# Patient Record
Sex: Male | Born: 1960 | Race: Black or African American | Hispanic: No | Marital: Single
Health system: Southern US, Community
[De-identification: ages and names within clinical notes are randomized; demographics above are authoritative.]

## PROBLEM LIST (undated history)

## (undated) DIAGNOSIS — F4310 Post-traumatic stress disorder, unspecified: Secondary | ICD-10-CM

## (undated) DIAGNOSIS — F1010 Alcohol abuse, uncomplicated: Secondary | ICD-10-CM

## (undated) DIAGNOSIS — F259 Schizoaffective disorder, unspecified: Secondary | ICD-10-CM

## (undated) DIAGNOSIS — F250 Schizoaffective disorder, bipolar type: Secondary | ICD-10-CM

## (undated) DIAGNOSIS — F319 Bipolar disorder, unspecified: Secondary | ICD-10-CM

## (undated) DIAGNOSIS — B009 Herpesviral infection, unspecified: Secondary | ICD-10-CM

## (undated) DIAGNOSIS — I10 Essential (primary) hypertension: Secondary | ICD-10-CM

## (undated) HISTORY — PX: CARDIAC SURGERY: SHX584

---

## 2017-04-29 ENCOUNTER — Ambulatory Visit (HOSPITAL_COMMUNITY): Admission: EM | Admit: 2017-04-29 | Discharge: 2017-04-29 | Discharge status: Against Medical Advice | Payer: Self-pay

## 2017-07-24 ENCOUNTER — Encounter (HOSPITAL_COMMUNITY): Payer: Self-pay

## 2017-07-24 ENCOUNTER — Emergency Department (HOSPITAL_COMMUNITY)
Admission: EM | Admit: 2017-07-24 | Discharge: 2017-07-24 | Disposition: A | Discharge status: Home / Self Care | Payer: Self-pay | Attending: Emergency Medicine | Admitting: Emergency Medicine

## 2017-07-24 ENCOUNTER — Other Ambulatory Visit: Payer: Self-pay

## 2017-07-24 DIAGNOSIS — Z7722 Contact with and (suspected) exposure to environmental tobacco smoke (acute) (chronic): Secondary | ICD-10-CM | POA: Insufficient documentation

## 2017-07-24 DIAGNOSIS — Z008 Encounter for other general examination: Secondary | ICD-10-CM

## 2017-07-24 DIAGNOSIS — Z0489 Encounter for examination and observation for other specified reasons: Secondary | ICD-10-CM | POA: Insufficient documentation

## 2017-07-24 DIAGNOSIS — I10 Essential (primary) hypertension: Secondary | ICD-10-CM | POA: Insufficient documentation

## 2017-07-24 HISTORY — DX: Post-traumatic stress disorder, unspecified: F43.10

## 2017-07-24 HISTORY — DX: Essential (primary) hypertension: I10

## 2017-07-24 HISTORY — DX: Schizoaffective disorder, unspecified: F25.9

## 2017-07-24 HISTORY — DX: Alcohol abuse, uncomplicated: F10.10

## 2017-07-24 HISTORY — DX: Schizoaffective disorder, bipolar type: F25.0

## 2017-07-24 HISTORY — DX: Bipolar disorder, unspecified: F31.9

## 2017-07-24 HISTORY — DX: Herpesviral infection, unspecified: B00.9

## 2017-07-24 LAB — CBC WITH DIFFERENTIAL/PLATELET
BASOS PCT: 0 %
Basophils Absolute: 0 10*3/uL (ref 0.0–0.1)
EOS ABS: 0.1 10*3/uL (ref 0.0–0.7)
Eosinophils Relative: 1 %
HCT: 46.1 % (ref 39.0–52.0)
HEMOGLOBIN: 15.7 g/dL (ref 13.0–17.0)
LYMPHS ABS: 1.5 10*3/uL (ref 0.7–4.0)
Lymphocytes Relative: 18 %
MCH: 27.5 pg (ref 26.0–34.0)
MCHC: 34.1 g/dL (ref 30.0–36.0)
MCV: 80.9 fL (ref 78.0–100.0)
Monocytes Absolute: 0.9 10*3/uL (ref 0.1–1.0)
Monocytes Relative: 11 %
NEUTROS PCT: 70 %
Neutro Abs: 5.6 10*3/uL (ref 1.7–7.7)
Platelets: 388 10*3/uL (ref 150–400)
RBC: 5.7 MIL/uL (ref 4.22–5.81)
RDW: 14.2 % (ref 11.5–15.5)
WBC: 8.1 10*3/uL (ref 4.0–10.5)

## 2017-07-24 LAB — COMPREHENSIVE METABOLIC PANEL
ALBUMIN: 4 g/dL (ref 3.5–5.0)
ALK PHOS: 58 U/L (ref 38–126)
ALT: 12 U/L — AB (ref 17–63)
AST: 17 U/L (ref 15–41)
Anion gap: 10 (ref 5–15)
BUN: 10 mg/dL (ref 6–20)
CALCIUM: 9.5 mg/dL (ref 8.9–10.3)
CO2: 26 mmol/L (ref 22–32)
CREATININE: 0.87 mg/dL (ref 0.61–1.24)
Chloride: 102 mmol/L (ref 101–111)
GFR calc Af Amer: >60 mL/min (ref >60)
GFR calc non Af Amer: >60 mL/min (ref >60)
GLUCOSE: 99 mg/dL (ref 65–99)
Potassium: 4.3 mmol/L (ref 3.5–5.1)
SODIUM: 138 mmol/L (ref 135–145)
Total Bilirubin: 1.8 mg/dL — ABNORMAL HIGH (ref 0.3–1.2)
Total Protein: 7.4 g/dL (ref 6.5–8.1)

## 2017-07-24 LAB — TSH: TSH: 0.905 u[IU]/mL (ref 0.350–4.500)
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
## 2017-07-24 NOTE — Discharge Instructions (Signed)
Please read attached information. If you experience any new or worsening signs or symptoms please return to the emergency room for evaluation. Please follow-up with your primary care provider or specialist as discussed. Please use medication prescribed only as directed and discontinue taking if you have any concerning signs or symptoms.   °

## 2017-07-24 NOTE — ED Notes (Signed)
VSS. Pt ambulatory to lobby with steady gait. Pt verbalized understanding of all d/c instructions and f/u with [facility].

## 2017-07-24 NOTE — ED Provider Notes (Addendum)
[facility] EMERGENCY DEPARTMENT Provider Note   CSN: [id number] Arrival date & time: 07/24/17  1539     History   Chief Complaint Chief Complaint  Patient presents with  . Medical Clearance    HPI Naeem Quillin III is a 57 y.o. male.  HPI   57 year old male presents today for medical clearance.  Patient notes he has had suicidal ideations with hallucinations.  He notes that he has placement at [facility] but needs CMP, CBC and TSH before they will accept him.  Patient is accompanied by his family member who will be taking him over to [facility] after medical clearance.  Patient denies any physical complaints today.  Past Medical History:  Diagnosis Date  . Alcohol abuse   . Bipolar 1 disorder (HCC)   . Herpes   . Hypertension   . PTSD (post-traumatic stress disorder)   . Schizo affective schizophrenia (HCC)     There are no active problems to display for this patient.   he histories are not reviewed yet. Please review them in the "History" navigator section and refresh this SmartLink.     Home Medications    Prior to Admission medications   Not on File    Family History No family history on file.  Social History Social History   Tobacco Use  . Smoking status: Passive Smoke Exposure - Never Smoker  . Smokeless tobacco: Never Used  Substance Use Topics  . Alcohol use: No    Frequency: Never  . Drug use: No     Allergies   Patient has no known allergies.   Review of Systems Review of Systems  All other systems reviewed and are negative.    Physical Exam Updated Vital Signs BP 125/86 (BP Location: Right Arm)   Pulse 80   Temp 98.2 F (36.8 C) (Oral)   Resp 16   Ht 5\' 5"  (1.651 m)   Wt 69.4 kg (153 lb)   SpO2 99%   BMI 25.46 kg/m   Physical Exam  Constitutional: He is oriented to person, place, and time. He appears well-developed and well-nourished.  HENT:  Head: Normocephalic and atraumatic.  Eyes: Conjunctivae are  normal. Pupils are equal, round, and reactive to light. Right eye exhibits no discharge. Left eye exhibits no discharge. No scleral icterus.  Neck: Normal range of motion. No JVD present. No tracheal deviation present.  Cardiovascular: Normal rate, regular rhythm, normal heart sounds and intact distal pulses. Exam reveals no gallop.  No murmur heard. Pulmonary/Chest: Effort normal and breath sounds normal. No stridor. No respiratory distress.  Neurological: He is alert and oriented to person, place, and time. Coordination normal.  Psychiatric: He has a normal mood and affect. His behavior is normal. Judgment and thought content normal.  Nursing note and vitals reviewed.    ED Treatments / Results  Labs (all labs ordered are listed, but only abnormal results are displayed) Labs Reviewed  COMPREHENSIVE METABOLIC PANEL - Abnormal; Notable for the following components:      Result Value   ALT 12 (*)    Total Bilirubin 1.8 (*)    All other components within normal limits  CBC WITH DIFFERENTIAL/PLATELET  TSH    EKG  EKG Interpretation None       Radiology No results found.  Procedures Procedures (including critical care time)  Medications Ordered in ED Medications - No data to display   Initial Impression / Assessment and Plan / ED Course  I have reviewed the  triage vital signs and the nursing notes.  Pertinent labs & imaging results that were available during my care of the patient were reviewed by me and considered in my medical decision making (see chart for details).      Final Clinical Impressions(s) / ED Diagnoses   Final diagnoses:  Medical clearance for psychiatric admission    Labs: CBC, CMP, TSH  Imaging:  Consults:  Therapeutics:  Discharge Meds:   Assessment/Plan: 57 year old male presents today for medical clearance from [facility].  Patient is well-appearing no acute distress.  He has no physical complaints.  He is medically cleared and will be  discharged with his brother for placement at [facility].  Patient is given return precautions, he verbalized understanding and agreement to today's plan.    ED Discharge Orders    None       Rosalio LoudHedges, Cipriano Millikan, PA-C 07/24/17 1823    Eyvonne MechanicHedges, Zyrus Hetland, PA-C 07/24/17 Leslye [id number]    Zackowski, Scott, MD 07/25/17 [phone]

## 2017-07-24 NOTE — ED Triage Notes (Signed)
Pt arrives to ED from [facility] for medical clearance. Pt has been accepted to facility and is to return to [facility] once medically cleared at this time. PT bother at bedside in triage.

## 2017-11-06 ENCOUNTER — Encounter (HOSPITAL_COMMUNITY): Payer: Self-pay

## 2017-11-06 ENCOUNTER — Emergency Department (HOSPITAL_COMMUNITY): Payer: Self-pay

## 2017-11-06 ENCOUNTER — Other Ambulatory Visit: Payer: Self-pay

## 2017-11-06 ENCOUNTER — Emergency Department (HOSPITAL_COMMUNITY)
Admission: EM | Admit: 2017-11-06 | Discharge: 2017-11-06 | Disposition: A | Discharge status: Home / Self Care | Payer: Self-pay | Attending: Emergency Medicine | Admitting: Emergency Medicine

## 2017-11-06 DIAGNOSIS — I10 Essential (primary) hypertension: Secondary | ICD-10-CM | POA: Insufficient documentation

## 2017-11-06 DIAGNOSIS — R319 Hematuria, unspecified: Secondary | ICD-10-CM | POA: Insufficient documentation

## 2017-11-06 DIAGNOSIS — Z7722 Contact with and (suspected) exposure to environmental tobacco smoke (acute) (chronic): Secondary | ICD-10-CM | POA: Insufficient documentation

## 2017-11-06 DIAGNOSIS — S161XXA Strain of muscle, fascia and tendon at neck level, initial encounter: Secondary | ICD-10-CM | POA: Insufficient documentation

## 2017-11-06 DIAGNOSIS — S39012A Strain of muscle, fascia and tendon of lower back, initial encounter: Secondary | ICD-10-CM | POA: Insufficient documentation

## 2017-11-06 DIAGNOSIS — R079 Chest pain, unspecified: Secondary | ICD-10-CM | POA: Insufficient documentation

## 2017-11-06 DIAGNOSIS — Y9389 Activity, other specified: Secondary | ICD-10-CM | POA: Insufficient documentation

## 2017-11-06 DIAGNOSIS — R51 Headache: Secondary | ICD-10-CM | POA: Insufficient documentation

## 2017-11-06 DIAGNOSIS — Y92410 Unspecified street and highway as the place of occurrence of the external cause: Secondary | ICD-10-CM | POA: Insufficient documentation

## 2017-11-06 DIAGNOSIS — Y999 Unspecified external cause status: Secondary | ICD-10-CM | POA: Insufficient documentation

## 2017-11-06 DIAGNOSIS — S5001XA Contusion of right elbow, initial encounter: Secondary | ICD-10-CM | POA: Insufficient documentation

## 2017-11-06 LAB — TYPE AND SCREEN
ABO/RH(D): A POS
Antibody Screen: NEGATIVE

## 2017-11-06 LAB — COMPREHENSIVE METABOLIC PANEL
ALBUMIN: 4.3 g/dL (ref 3.5–5.0)
ALT: 14 U/L — AB (ref 17–63)
ANION GAP: 9 (ref 5–15)
AST: 20 U/L (ref 15–41)
Alkaline Phosphatase: 49 U/L (ref 38–126)
BUN: 10 mg/dL (ref 6–20)
CHLORIDE: 103 mmol/L (ref 101–111)
CO2: 25 mmol/L (ref 22–32)
CREATININE: 0.99 mg/dL (ref 0.61–1.24)
Calcium: 9.5 mg/dL (ref 8.9–10.3)
GFR calc non Af Amer: >60 mL/min (ref >60)
Glucose, Bld: 98 mg/dL (ref 65–99)
Potassium: 3.9 mmol/L (ref 3.5–5.1)
SODIUM: 137 mmol/L (ref 135–145)
Total Bilirubin: 1.2 mg/dL (ref 0.3–1.2)
Total Protein: 7.2 g/dL (ref 6.5–8.1)

## 2017-11-06 LAB — CBC WITH DIFFERENTIAL/PLATELET
Abs Immature Granulocytes: 0.1 10*3/uL (ref 0.0–0.1)
Basophils Absolute: 0.1 10*3/uL (ref 0.0–0.1)
Basophils Relative: 0 %
Eosinophils Absolute: 0 10*3/uL (ref 0.0–0.7)
Eosinophils Relative: 0 %
HEMATOCRIT: 42.4 % (ref 39.0–52.0)
Hemoglobin: 14.3 g/dL (ref 13.0–17.0)
IMMATURE GRANULOCYTES: 1 %
LYMPHS ABS: 1.1 10*3/uL (ref 0.7–4.0)
Lymphocytes Relative: 6 %
MCH: 27 pg (ref 26.0–34.0)
MCHC: 33.7 g/dL (ref 30.0–36.0)
MCV: 80 fL (ref 78.0–100.0)
MONOS PCT: 8 %
Monocytes Absolute: 1.5 10*3/uL — ABNORMAL HIGH (ref 0.1–1.0)
NEUTROS ABS: 16.6 10*3/uL — AB (ref 1.7–7.7)
NEUTROS PCT: 85 %
Platelets: 417 10*3/uL — ABNORMAL HIGH (ref 150–400)
RBC: 5.3 MIL/uL (ref 4.22–5.81)
RDW: 13.6 % (ref 11.5–15.5)
WBC: 19.4 10*3/uL — ABNORMAL HIGH (ref 4.0–10.5)

## 2017-11-06 LAB — URINALYSIS, MICROSCOPIC (REFLEX)

## 2017-11-06 LAB — URINALYSIS, ROUTINE W REFLEX MICROSCOPIC

## 2017-11-06 LAB — ABO/RH: ABO/RH(D): A POS

## 2017-11-06 MED ORDER — IOHEXOL 300 MG/ML  SOLN
100.0000 mL | Freq: Once | INTRAMUSCULAR | Status: AC | PRN
  Administered 2017-11-06: 100 mL via INTRAVENOUS

## 2017-11-06 MED ORDER — MORPHINE SULFATE (PF) 4 MG/ML IV SOLN
4.0000 mg | INTRAVENOUS | Status: DC | PRN
Start: 2017-11-06 — End: 2017-11-07

## 2017-11-06 MED ORDER — OXYCODONE-ACETAMINOPHEN 5-325 MG PO TABS: 1.0000 | ORAL_TABLET | Freq: Three times a day (TID) | ORAL | 0 refills | Status: DC | PRN

## 2017-11-06 MED ORDER — METHOCARBAMOL 500 MG PO TABS: 500.0000 mg | ORAL_TABLET | Freq: Two times a day (BID) | ORAL | 0 refills | Status: DC

## 2017-11-06 MED ORDER — CEPHALEXIN 500 MG PO CAPS: 500.0000 mg | ORAL_CAPSULE | Freq: Two times a day (BID) | ORAL | 0 refills | Status: AC

## 2017-11-06 MED ORDER — MORPHINE SULFATE (PF) 4 MG/ML IV SOLN
4.0000 mg | Freq: Once | INTRAVENOUS | Status: AC
  Administered 2017-11-06: 4 mg via INTRAVENOUS
  Filled 2017-11-06: qty 1

## 2017-11-06 MED ORDER — IOPAMIDOL (ISOVUE-300) INJECTION 61%
INTRAVENOUS | Status: AC
  Filled 2017-11-06: qty 50

## 2017-11-06 MED ORDER — SODIUM CHLORIDE 0.9 % IV BOLUS
1000.0000 mL | Freq: Once | INTRAVENOUS | Status: AC
  Administered 2017-11-06: 1000 mL via INTRAVENOUS
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
## 2017-11-06 NOTE — ED Notes (Signed)
Pt ambulated in hallway without assistance by staff. Leg bag applied, foley care education performed; food provided to pt.

## 2017-11-06 NOTE — ED Notes (Signed)
ED Provider at bedside. 
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
## 2017-11-06 NOTE — ED Notes (Signed)
Patient transported to CT 
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
## 2017-11-06 NOTE — ED Notes (Signed)
Patient verbalizes understanding of discharge instructions. Opportunity for questioning and answers were provided. Armband removed by staff, pt discharged from ED via wheelchair with male companion.

## 2018-05-19 ENCOUNTER — Emergency Department (HOSPITAL_COMMUNITY)
Admission: EM | Admit: 2018-05-19 | Discharge: 2018-05-19 | Disposition: A | Discharge status: Home / Self Care | Payer: Self-pay | Attending: Emergency Medicine | Admitting: Emergency Medicine

## 2018-05-19 DIAGNOSIS — B001 Herpesviral vesicular dermatitis: Secondary | ICD-10-CM | POA: Insufficient documentation

## 2018-05-19 DIAGNOSIS — Z79899 Other long term (current) drug therapy: Secondary | ICD-10-CM | POA: Insufficient documentation

## 2018-05-19 DIAGNOSIS — Z7722 Contact with and (suspected) exposure to environmental tobacco smoke (acute) (chronic): Secondary | ICD-10-CM | POA: Insufficient documentation

## 2018-05-19 DIAGNOSIS — I10 Essential (primary) hypertension: Secondary | ICD-10-CM | POA: Insufficient documentation

## 2018-05-19 MED ORDER — ACYCLOVIR 400 MG PO TABS: 400.0000 mg | ORAL_TABLET | Freq: Three times a day (TID) | ORAL | 0 refills | Status: DC

## 2018-05-19 NOTE — ED Provider Notes (Signed)
[facility] Provider Note   CSN: [id number] Arrival date & time: 05/19/18  1719     History   Chief Complaint Chief Complaint  Patient presents with  . Mouth Lesions    HPI Seth Bright is a 57 y.o. male.  HPI Complains of mildly painful sore on his upper lip which started yesterday.  Feels like herpes sore but he has had in the past.  No other associated symptoms.  No treatment prior to coming here.  Nothing makes symptoms better or worse. Past Medical History:  Diagnosis Date  . Alcohol abuse   . Bipolar 1 disorder (HCC)   . Herpes   . Hypertension   . PTSD (post-traumatic stress disorder)   . Schizo affective schizophrenia (HCC)     There are no active problems to display for this patient.   Past Surgical History:  Procedure Laterality Date  . CARDIAC SURGERY     stent placement 2016        Home Medications    Prior to Admission medications   Medication Sig Start Date End Date Taking? Authorizing Provider  methocarbamol (ROBAXIN) 500 MG tablet Take 1 tablet (500 mg total) by mouth 2 (two) times daily. 11/06/17   McDonald, Mia A, PA-C  oxyCODONE-acetaminophen (PERCOCET/ROXICET) 5-325 MG tablet Take 1 tablet by mouth every 8 (eight) hours as needed for severe pain. 11/06/17   McDonald, Mia A, PA-C    Family History No family history on file.  Social History Social History   Tobacco Use  . Smoking status: Passive Smoke Exposure - Never Smoker  . Smokeless tobacco: Never Used  Substance Use Topics  . Alcohol use: No    Frequency: Never  . Drug use: No     Allergies   Patient has no known allergies.   Review of Systems Review of Systems  Constitutional: Negative.   Skin: Positive for wound.       Lip lesion     Physical Exam Updated Vital Signs BP 122/81 (BP Location: Right Arm)   Pulse 83   Temp 98.7 F (37.1 C) (Oral)   Resp 17   Ht 5\' 5"  (1.651 m)   Wt 72.6 kg   SpO2 96%   BMI 26.63  kg/m   Physical Exam Vitals signs and nursing note reviewed.  Constitutional:      Appearance: Normal appearance.  HENT:     Head: Normocephalic and atraumatic.     Right Ear: External ear normal.     Left Ear: External ear normal.     Mouth/Throat:     Mouth: Mucous membranes are moist.     Comments: 2 to 3 mm white shallow ulcer on upper lip no other mucosal lesion Eyes:     Pupils: Pupils are equal, round, and reactive to light.  Neck:     Musculoskeletal: Normal range of motion.  Cardiovascular:     Rate and Rhythm: Normal rate.  Pulmonary:     Effort: Pulmonary effort is normal.  Abdominal:     General: There is no distension.  Skin:    General: Skin is warm and dry.     Findings: No rash.  Neurological:     Mental Status: He is alert and oriented to person, place, and time.  Psychiatric:        Behavior: Behavior normal.      ED Treatments / Results  Labs (all labs ordered are listed, but only abnormal results are displayed)  Labs Reviewed - No data to display  EKG None  Radiology No results found.  Procedures Procedures (including critical care time)  Medications Ordered in ED Medications - No data to display   Initial Impression / Assessment and Plan / ED Course  I have reviewed the triage vital signs and the nursing notes.  Pertinent labs & imaging results that were available during my care of the patient were reviewed by me and considered in my medical decision making (see chart for details).     Explained to patient that herpes labialis is self-limiting.  Antivirals may reduce length of illness slightly.  He is requesting prescription.  Plan prescription acyclovir  Final Clinical Impressions(s) / ED Diagnoses  Diagnosis herpes labialis (cold Sore) Final diagnoses:  None    ED Discharge Orders    None       Doug SouJacubowitz, Emelio Schneller, MD 05/19/18 1743

## 2018-05-19 NOTE — ED Triage Notes (Signed)
Pt has sore to upper lip since yesterday

## 2018-05-28 ENCOUNTER — Emergency Department (HOSPITAL_COMMUNITY)
Admission: EM | Admit: 2018-05-28 | Discharge: 2018-05-31 | Disposition: A | Discharge status: Other Acute Inpatient Hospital | Payer: Medicaid Other | Attending: Emergency Medicine | Admitting: Emergency Medicine

## 2018-05-28 DIAGNOSIS — F209 Schizophrenia, unspecified: Secondary | ICD-10-CM | POA: Diagnosis not present

## 2018-05-28 DIAGNOSIS — R45851 Suicidal ideations: Secondary | ICD-10-CM | POA: Insufficient documentation

## 2018-05-28 DIAGNOSIS — Z79899 Other long term (current) drug therapy: Secondary | ICD-10-CM | POA: Insufficient documentation

## 2018-05-28 DIAGNOSIS — I10 Essential (primary) hypertension: Secondary | ICD-10-CM | POA: Insufficient documentation

## 2018-05-28 DIAGNOSIS — Z7722 Contact with and (suspected) exposure to environmental tobacco smoke (acute) (chronic): Secondary | ICD-10-CM | POA: Diagnosis not present

## 2018-05-28 LAB — RAPID URINE DRUG SCREEN, HOSP PERFORMED
Amphetamines: NOT DETECTED
BENZODIAZEPINES: NOT DETECTED
Barbiturates: NOT DETECTED
Cocaine: NOT DETECTED
OPIATES: NOT DETECTED
Tetrahydrocannabinol: NOT DETECTED

## 2018-05-28 LAB — COMPREHENSIVE METABOLIC PANEL
ALK PHOS: 49 U/L (ref 38–126)
ALT: 15 U/L (ref 0–44)
ANION GAP: 10 (ref 5–15)
AST: 16 U/L (ref 15–41)
Albumin: 3.8 g/dL (ref 3.5–5.0)
BILIRUBIN TOTAL: 0.9 mg/dL (ref 0.3–1.2)
BUN: 5 mg/dL — ABNORMAL LOW (ref 6–20)
CALCIUM: 8.9 mg/dL (ref 8.9–10.3)
CO2: 23 mmol/L (ref 22–32)
Chloride: 106 mmol/L (ref 98–111)
Creatinine, Ser: 0.83 mg/dL (ref 0.61–1.24)
GLUCOSE: 118 mg/dL — AB (ref 70–99)
POTASSIUM: 3.7 mmol/L (ref 3.5–5.1)
Sodium: 139 mmol/L (ref 135–145)
TOTAL PROTEIN: 6.8 g/dL (ref 6.5–8.1)

## 2018-05-28 LAB — CBC
HCT: 44.5 % (ref 39.0–52.0)
Hemoglobin: 14.8 g/dL (ref 13.0–17.0)
MCH: 27.3 pg (ref 26.0–34.0)
MCHC: 33.3 g/dL (ref 30.0–36.0)
MCV: 82 fL (ref 80.0–100.0)
NRBC: 0 % (ref 0.0–0.2)
PLATELETS: 407 10*3/uL — AB (ref 150–400)
RBC: 5.43 MIL/uL (ref 4.22–5.81)
RDW: 14 % (ref 11.5–15.5)
WBC: 6.7 10*3/uL (ref 4.0–10.5)

## 2018-05-28 LAB — ETHANOL

## 2018-05-28 NOTE — ED Triage Notes (Signed)
Pt states that he has been having thoughts of harming himself as well as other people for the past couple of days. Family suggested that he come to the ED and be evaluated. Pt states that he planned to shoot other people and then shoot himself.

## 2018-05-29 LAB — LITHIUM LEVEL: Lithium Lvl: 0.08 mmol/L — ABNORMAL LOW (ref 0.60–1.20)

## 2018-05-29 MED ORDER — LITHIUM CARBONATE 300 MG PO CAPS
300.0000 mg | ORAL_CAPSULE | Freq: Three times a day (TID) | ORAL | Status: DC
  Administered 2018-05-29 – 2018-05-31 (×6): 300 mg via ORAL
  Filled 2018-05-29 (×7): qty 1

## 2018-05-29 MED ORDER — QUETIAPINE FUMARATE 200 MG PO TABS
200.0000 mg | ORAL_TABLET | Freq: Every day | ORAL | Status: DC
  Administered 2018-05-29 – 2018-05-30 (×2): 200 mg via ORAL
  Filled 2018-05-29 (×2): qty 1

## 2018-05-29 MED ORDER — GABAPENTIN 300 MG PO CAPS
300.0000 mg | ORAL_CAPSULE | Freq: Three times a day (TID) | ORAL | Status: DC
  Administered 2018-05-29 – 2018-05-31 (×7): 300 mg via ORAL
  Filled 2018-05-29 (×7): qty 1
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
## 2018-05-29 NOTE — BH Assessment (Signed)
Contacted pt's room via [employer] to complete [facility] assessment but there was no answer; will attempt again at a later time.
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
## 2018-05-29 NOTE — ED Notes (Signed)
Patient making his 1st phone call at this time. 

## 2018-05-29 NOTE — ED Notes (Signed)
Regular Diet was ordered for Lunch. 

## 2018-05-29 NOTE — ED Notes (Signed)
Ordered breakfast tray, Diet Regular -no sharps

## 2018-05-29 NOTE — ED Notes (Signed)
Pt seen to be ambulatory to the bathroom. Steady gait, no difficulty noted.

## 2018-05-29 NOTE — ED Notes (Signed)
Patient is resting comfortably. 

## 2018-05-29 NOTE — ED Provider Notes (Signed)
[facility] Provider Note   CSN: [id number] Arrival date & time: 05/28/18  [id number]     History   Chief Complaint Chief Complaint  Patient presents with  . Suicidal    HPI Seth Bright is a 58 y.o. male.  The history is provided by the patient and medical records.    58 year old male with history of alcohol abuse, bipolar disorder, hypertension, PTSD, schizoaffective disorder, presenting to the ED with suicidal and homicidal ideation.  Patient reports he got out of prison about 1 year ago after a 29-year sentence.  States once he left, he did not really understand how to adjust back into society.  States he has been staying with his sister and taking jobs from the temp agency but states "I can't get my mind right".  He describes his mind as a room that is cluttered with people in numerous different voices and scenarios that are going on.  States he cannot seem to organize his thoughts or make sense of what is going on.  States some days are better than others but lately has been getting substantially worse.  States whenever he tries to go to his job or is out in public he constantly feels paranoid like people are out to get him are talking about him behind his back.  States he thinks this was from being institutionalized for so long and having to "watch his back".  States sometimes he does not really know what to do, not sure if he wants to kill himself and just be rid of everything or her other people.  He has not made any attempts at self-harm.  He does admit he has thought about overdosing on pills or shooting himself.  He does not currently have access to weapons.  He was previously staying with his sister but states he decided to leave there to see if he would do better on his own.  He has been staying in his car for the past week does not really been resting or eating regularly.  States he has not been taking his prescribed medications and has not  had any recent follow-up with his psychiatrist.  He states he truly wants to get his mind right so that he can function normally again. He denies any EtOH or substance abuse. No hallucinations reported.    Past Medical History:  Diagnosis Date  . Alcohol abuse   . Bipolar 1 disorder (HCC)   . Herpes   . Hypertension   . PTSD (post-traumatic stress disorder)   . Schizo affective schizophrenia (HCC)     There are no active problems to display for this patient.   Past Surgical History:  Procedure Laterality Date  . CARDIAC SURGERY     stent placement 2016        Home Medications    Prior to Admission medications   Medication Sig Start Date End Date Taking? Authorizing Provider  gabapentin (NEURONTIN) 300 MG capsule Take 300 mg by mouth 3 (three) times daily.   Yes [provider]  lithium carbonate 300 MG capsule Take 300 mg by mouth 3 (three) times daily with meals.   Yes [provider]  QUEtiapine (SEROQUEL) 200 MG tablet Take 200 mg by mouth at bedtime.   Yes [provider]  acyclovir (ZOVIRAX) 400 MG tablet Take 1 tablet (400 mg total) by mouth 3 (three) times daily. Patient not taking: Reported on 05/29/2018 05/19/18   Doug SouJacubowitz, Sam, MD  methocarbamol (  ROBAXIN) 500 MG tablet Take 1 tablet (500 mg total) by mouth 2 (two) times daily. Patient not taking: Reported on 05/29/2018 11/06/17   McDonald, Pedro EarlsMia A, PA-C  oxyCODONE-acetaminophen (PERCOCET/ROXICET) 5-325 MG tablet Take 1 tablet by mouth every 8 (eight) hours as needed for severe pain. Patient not taking: Reported on 05/29/2018 11/06/17   Barkley BoardsMcDonald, Mia A, PA-C    Family History No family history on file.  Social History Social History   Tobacco Use  . Smoking status: Passive Smoke Exposure - Never Smoker  . Smokeless tobacco: Never Used  Substance Use Topics  . Alcohol use: No    Frequency: Never  . Drug use: No     Allergies   Patient has no known allergies.   Review of  Systems Review of Systems  Psychiatric/Behavioral: Positive for suicidal ideas.  All other systems reviewed and are negative.    Physical Exam Updated Vital Signs BP 138/87 (BP Location: Right Arm)   Pulse 79   Temp 98.6 F (37 C)   Resp 16   Ht 5\' 5"  (1.651 m)   Wt 72.6 kg   SpO2 98%   BMI 26.63 kg/m   Physical Exam Vitals signs and nursing note reviewed.  Constitutional:      Appearance: He is well-developed.  HENT:     Head: Normocephalic and atraumatic.  Eyes:     Conjunctiva/sclera: Conjunctivae normal.     Pupils: Pupils are equal, round, and reactive to light.  Neck:     Musculoskeletal: Normal range of motion.  Cardiovascular:     Rate and Rhythm: Normal rate and regular rhythm.     Heart sounds: Normal heart sounds.  Pulmonary:     Effort: Pulmonary effort is normal.     Breath sounds: Normal breath sounds. No stridor. No rhonchi.  Abdominal:     General: Bowel sounds are normal.     Palpations: Abdomen is soft.  Musculoskeletal: Normal range of motion.  Skin:    General: Skin is warm and dry.  Neurological:     Mental Status: He is alert and oriented to person, place, and time.  Psychiatric:        Attention and Perception: He does not perceive auditory hallucinations.        Thought Content: Thought content includes homicidal and suicidal ideation. Thought content includes homicidal and suicidal plan.     Comments: Seems overwhelmed, has trouble expressing himself and seems worried, he is not paranoid during my exam SI with plan to OD or shoot himself; some passive homicidal thoughts with plan to shoot people around him      ED Treatments / Results  Labs (all labs ordered are listed, but only abnormal results are displayed) Labs Reviewed  COMPREHENSIVE METABOLIC PANEL - Abnormal; Notable for the following components:      Result Value   Glucose, Bld 118 (*)    BUN 5 (*)    All other components within normal limits  CBC - Abnormal; Notable for  the following components:   Platelets 407 (*)    All other components within normal limits  LITHIUM LEVEL - Abnormal; Notable for the following components:   Lithium Lvl 0.08 (*)    All other components within normal limits  ETHANOL  RAPID URINE DRUG SCREEN, HOSP PERFORMED    EKG None  Radiology No results found.  Procedures Procedures (including critical care time)  Medications Ordered in ED Medications - No data to display   Initial Impression /  Assessment and Plan / ED Course  I have reviewed the triage vital signs and the nursing notes.  Pertinent labs & imaging results that were available during my care of the patient were reviewed by me and considered in my medical decision making (see chart for details).  58 year old male here with suicidal and homicidal ideation.  Released from prison approximately 1 year ago after 29-year incarceration.  It sounds like since being released he has had trouble adjusting to normal life again.  He describes his mind is a "crowded room" and has trouble compartmentalizing and figuring out what all is going on.  States some days are better than others but overall lately he has been having a lot of trouble with this.  He does report sometimes he feels suicidal, has had thoughts of shooting himself or taking medications.  States other times he feels like shooting other people.  He does not currently have access to weapons.  He denies any physical complaints.  His labs are overall reassuring.  States he has been noncompliant with his home medications and has not followed up with his psychiatrist.  We will plan for TTS evaluation.  Final Clinical Impressions(s) / ED Diagnoses   Final diagnoses:  Suicidal ideation    ED Discharge Orders    None       Garlon Hatchet, PA-C 05/29/18 0626    Ward, Layla Maw, DO 05/29/18 [phone]

## 2018-05-29 NOTE — Progress Notes (Signed)
Pt. meets criteria for inpatient treatment per Assunta Found, NP.  Referred out to the following hospitals:  [facility]  [facility]  [facility]  [facility]  [facility]  [facility]  [facility]  [facility]  [facility]  [facility]     Disposition CSW will continue to follow for placement.  Timmothy Euler. Kaylyn Lim, MSW, LCSWA Disposition Clinical Social Work [phone] (cell) [phone] (office)

## 2018-05-29 NOTE — ED Notes (Signed)
Meal tray arrived

## 2018-05-30 NOTE — ED Notes (Signed)
Patient ambulatory to bathroom with steady gait at this time 

## 2018-05-30 NOTE — ED Notes (Signed)
Patient currently using his 2nd phone call of the day. Calm and cooperative.

## 2018-05-30 NOTE — ED Notes (Signed)
This RN asked patient to get off the phone since it had been longer than , patient complied and got off the phone, now back in room.

## 2018-05-30 NOTE — BH Assessment (Signed)
Pt reports he rested well due to Seroquel and has not eaten this AM yet but has an appetite. Pt reports he still has SI, "thoughts running through my head"the patient denies HI and AH/VH. Pt continues to meet inpatient criteria.

## 2018-05-30 NOTE — ED Notes (Addendum)
Patient asked to use the phone for , patient was told he was able to by this RN.

## 2018-05-30 NOTE — ED Notes (Signed)
Pt is at desk calling his wife back-Monique,RN

## 2018-05-30 NOTE — ED Notes (Signed)
Patient ambulatory to shower without difficulty.

## 2018-05-30 NOTE — ED Notes (Signed)
Dinner tray ordered for patient.

## 2018-05-31 ENCOUNTER — Encounter (HOSPITAL_COMMUNITY): Payer: Self-pay | Admitting: *Deleted

## 2018-05-31 ENCOUNTER — Inpatient Hospital Stay (HOSPITAL_COMMUNITY)
Admission: AD | Admit: 2018-05-31 | Discharge: 2018-06-07 | DRG: 885 | Disposition: A | Discharge status: Home / Self Care | Payer: Medicaid Other | Source: Intra-hospital | Attending: Psychiatry | Admitting: Psychiatry

## 2018-05-31 ENCOUNTER — Other Ambulatory Visit: Payer: Self-pay

## 2018-05-31 DIAGNOSIS — G4700 Insomnia, unspecified: Secondary | ICD-10-CM | POA: Diagnosis present

## 2018-05-31 DIAGNOSIS — Z9114 Patient's other noncompliance with medication regimen: Secondary | ICD-10-CM | POA: Diagnosis not present

## 2018-05-31 DIAGNOSIS — Z79899 Other long term (current) drug therapy: Secondary | ICD-10-CM | POA: Diagnosis not present

## 2018-05-31 DIAGNOSIS — F4310 Post-traumatic stress disorder, unspecified: Secondary | ICD-10-CM | POA: Diagnosis not present

## 2018-05-31 DIAGNOSIS — Z915 Personal history of self-harm: Secondary | ICD-10-CM | POA: Diagnosis not present

## 2018-05-31 DIAGNOSIS — F312 Bipolar disorder, current episode manic severe with psychotic features: Secondary | ICD-10-CM | POA: Diagnosis not present

## 2018-05-31 DIAGNOSIS — R41843 Psychomotor deficit: Secondary | ICD-10-CM | POA: Diagnosis present

## 2018-05-31 DIAGNOSIS — Z818 Family history of other mental and behavioral disorders: Secondary | ICD-10-CM | POA: Diagnosis not present

## 2018-05-31 DIAGNOSIS — Z23 Encounter for immunization: Secondary | ICD-10-CM | POA: Diagnosis not present

## 2018-05-31 DIAGNOSIS — I10 Essential (primary) hypertension: Secondary | ICD-10-CM | POA: Diagnosis present

## 2018-05-31 DIAGNOSIS — F259 Schizoaffective disorder, unspecified: Secondary | ICD-10-CM | POA: Diagnosis present

## 2018-05-31 DIAGNOSIS — R45851 Suicidal ideations: Secondary | ICD-10-CM | POA: Diagnosis present

## 2018-05-31 DIAGNOSIS — F3110 Bipolar disorder, current episode manic without psychotic features, unspecified: Principal | ICD-10-CM | POA: Diagnosis present

## 2018-05-31 DIAGNOSIS — F209 Schizophrenia, unspecified: Secondary | ICD-10-CM | POA: Diagnosis not present

## 2018-05-31 DIAGNOSIS — F4312 Post-traumatic stress disorder, chronic: Secondary | ICD-10-CM | POA: Diagnosis present

## 2018-05-31 DIAGNOSIS — Z7722 Contact with and (suspected) exposure to environmental tobacco smoke (acute) (chronic): Secondary | ICD-10-CM | POA: Diagnosis present

## 2018-05-31 DIAGNOSIS — R45850 Homicidal ideations: Secondary | ICD-10-CM | POA: Diagnosis present

## 2018-05-31 DIAGNOSIS — F419 Anxiety disorder, unspecified: Secondary | ICD-10-CM | POA: Diagnosis not present

## 2018-05-31 MED ORDER — HYDROXYZINE HCL 50 MG PO TABS: 50.0000 mg | ORAL_TABLET | Freq: Three times a day (TID) | ORAL | Status: DC | PRN

## 2018-05-31 MED ORDER — QUETIAPINE FUMARATE 200 MG PO TABS
200.0000 mg | ORAL_TABLET | Freq: Every day | ORAL | Status: DC
  Administered 2018-05-31 – 2018-06-06 (×7): 200 mg via ORAL
  Filled 2018-05-31 (×9): qty 1

## 2018-05-31 MED ORDER — LORAZEPAM 1 MG PO TABS: 1.0000 mg | ORAL_TABLET | ORAL | Status: DC | PRN

## 2018-05-31 MED ORDER — MAGNESIUM HYDROXIDE 400 MG/5ML PO SUSP: 30.0000 mL | Freq: Every day | ORAL | Status: DC | PRN

## 2018-05-31 MED ORDER — ALUM & MAG HYDROXIDE-SIMETH 200-200-20 MG/5ML PO SUSP: 30.0000 mL | ORAL | Status: DC | PRN

## 2018-05-31 MED ORDER — TRAZODONE HCL 100 MG PO TABS
100.0000 mg | ORAL_TABLET | Freq: Every evening | ORAL | Status: DC | PRN
  Administered 2018-05-31 – 2018-06-03 (×4): 100 mg via ORAL
  Filled 2018-05-31 (×4): qty 1

## 2018-05-31 MED ORDER — ZIPRASIDONE MESYLATE 20 MG IM SOLR: 20.0000 mg | Freq: Two times a day (BID) | INTRAMUSCULAR | Status: DC | PRN

## 2018-05-31 MED ORDER — ACETAMINOPHEN 325 MG PO TABS: 650.0000 mg | ORAL_TABLET | Freq: Four times a day (QID) | ORAL | Status: DC | PRN

## 2018-05-31 MED ORDER — LITHIUM CARBONATE 300 MG PO CAPS
300.0000 mg | ORAL_CAPSULE | Freq: Three times a day (TID) | ORAL | Status: DC
  Administered 2018-05-31 – 2018-06-04 (×12): 300 mg via ORAL
  Filled 2018-05-31 (×14): qty 1

## 2018-05-31 MED ORDER — GABAPENTIN 300 MG PO CAPS
300.0000 mg | ORAL_CAPSULE | Freq: Three times a day (TID) | ORAL | Status: DC
  Administered 2018-05-31 – 2018-06-07 (×21): 300 mg via ORAL
  Filled 2018-05-31 (×24): qty 1

## 2018-05-31 MED ORDER — LORAZEPAM 1 MG PO TABS
1.0000 mg | ORAL_TABLET | ORAL | Status: DC | PRN
  Administered 2018-06-01 – 2018-06-07 (×8): 1 mg via ORAL
  Filled 2018-05-31 (×9): qty 1

## 2018-05-31 NOTE — Progress Notes (Signed)
This is the first admission for this 58 yo AA male, who is admitted top [facility] today ( from [facility] ED)- where he presented, with cc of suicidality from not feeling like he could reacclamate- after rejoining society  From being incarcerated in prison for over 29 years. He does not offer why he was incearcerated.   He is quiet depressed. HE is minimal, does not offer any additional info during the admission process. He minimizes  His depression, as well as his feelings. He says he has bipolar disorder, is schizoaffective and " doesn't know" when, exactly, that he received this diagnosis. He shares that he takes po lithium and neurontin and that" is it". He shakes his head " no" in response to writer asking him assessment questions. He says " I can't do it... I don't want to talk about it..can't I  just  go to my room?". He offers no further info, he is willing to contract with this Clinical research associate to not hurt himself, and admission is completed by this Clinical research associate.

## 2018-05-31 NOTE — ED Notes (Addendum)
[facility] arrived, pt given all belongings and valuables retrieved from security. Pt left with [facility] for transfer to [facility]

## 2018-05-31 NOTE — H&P (Signed)
Psychiatric Admission Assessment Adult  Patient Identification: Seth Bright MRN:  [id number] Date of Evaluation:  05/31/2018 Chief Complaint:  BIPOLAR 1 DISORDER Principal Diagnosis: <principal problem not specified> Diagnosis:  Active Problems:   Bipolar affective disorder, current episode manic (HCC)  History of Present Illness: Patient is seen and examined.  Patient is a 58 year old male with a past psychiatric history significant for schizoaffective disorder; depressive episode who presented to the [facility] emergency department on 05/29/2018 with suicidal and homicidal ideation.  Patient stated that he had been in prison for 29 years for murder.  He was released approximately 18 months ago.  He has had a difficult time adjusting to being outside of the prison setting.  He has [facility] receiving [facility] and [facility].  Unfortunately he has been noncompliant with his medications.  He stated he had been on medications throughout his imprisonment.  He also admitted that he had been noncompliant with his medications while he was in prison.  [facility] admitted [facility] attempting [facility].  He had attempted to cut his left arm, hanging self and other methods.  He then returned to the [facility].  He was hospitalized at [facility] prison several times, and most recently had an inpatient hospitalization at [facility] shortly after he was discharged from prison.  He does not like being around others, and feels very uncomfortable.  He has not been able to adjust to being out of the population.  He did have a job, but had to quit because he became so nervous and anxious with all the people around him.  He denied any auditory or visual hallucinations.  He does have some paranoid thinking.  He stated he has been living in his car recently because he could not stand being around  people, and felt better in an enclosed area.  He did admit that the medications did help him when he took them.  His laboratories revealed a lithium level of 0.08, but negative drug screen, negative blood alcohol.  He denied any substance use.  Otherwise laboratories reviewed were all within normal limits.  He was admitted to the hospital for evaluation and stabilization.   Associated Signs/Symptoms: Depression Symptoms:  depressed mood, anhedonia, insomnia, psychomotor retardation, fatigue, feelings of worthlessness/guilt, difficulty concentrating, hopelessness, suicidal thoughts without plan, suicidal attempt, loss of energy/fatigue, disturbed sleep, weight loss, (Hypo) Manic Symptoms:  Delusions, Anxiety Symptoms:  Excessive Worry, Psychotic Symptoms:  Delusions, Paranoia, PTSD Symptoms: Negative Total Time spent with patient: 45 minutes  Past Psychiatric History: Patient admitted to several psychiatric hospitalizations while he was in prison.  He stated that he had been noncompliant with his medications in prison as well as on the outside.  He had attempted to kill himself several times while he was in the prison population.  Shortly after he was discharged from prison he was hospitalized at [facility] in 2018.  He has been followed by an outpatient agency that has been following him psychiatrically.  He is recently been prescribed lithium and Seroquel.  This was what he was prescribed as an outpatient after discharge from prison.  Is the patient at risk to self? Yes.    Has the patient been a risk to self in the past 6 months? Yes.    Has the patient been a risk to self within the distant past? Yes.    Is the  patient a risk to others? Yes.    Has the patient been a risk to others in the past 6 months? Yes.    Has the patient been a risk to others within the distant past? Yes.     Prior Inpatient Therapy:   Prior Outpatient Therapy:    Alcohol Screening:   Substance Abuse  History in the last 12 months:  No. Consequences of Substance Abuse: Negative Previous Psychotropic Medications: Yes  Psychological Evaluations: Yes  Past Medical History:  Past Medical History:  Diagnosis Date  . Alcohol abuse   . Bipolar 1 disorder (HCC)   . Herpes   . Hypertension   . PTSD (post-traumatic stress disorder)   . Schizo affective schizophrenia [facility])     Past Surgical History:  Procedure Laterality Date  . CARDIAC SURGERY     stent placement 2016   Family History: No family history on file. Family Psychiatric  History: Patient stated that several of his secondary relatives had schizophrenia and other psychiatric disorders. Tobacco Screening:   Social History:  Social History   Substance and Sexual Activity  Alcohol Use No  . Frequency: Never     Social History   Substance and Sexual Activity  Drug Use No    Additional Social History:                           Allergies:  No Known Allergies Lab Results: No results found for this or any previous visit (from the past 48 hour(s)).  Blood Alcohol level:  Lab Results  Component Value Date   ETH <10 05/28/2018    Metabolic Disorder Labs:  No results found for: HGBA1C, MPG No results found for: PROLACTIN No results found for: CHOL, TRIG, HDL, CHOLHDL, VLDL, LDLCALC  Current Medications: Current Facility-Administered Medications  Medication Dose Route Frequency Provider Last Rate Last Dose  . acetaminophen (TYLENOL) tablet 650 mg  650 mg Oral Q6H PRN Antonieta Pert, MD      . alum & mag hydroxide-simeth (MAALOX/MYLANTA) 200-200-20 MG/5ML suspension 30 mL  30 mL Oral Q4H PRN Antonieta Pert, MD      . gabapentin (NEURONTIN) capsule 300 mg  300 mg Oral TID Antonieta Pert, MD      . hydrOXYzine (ATARAX/VISTARIL) tablet 50 mg  50 mg Oral TID PRN Antonieta Pert, MD      . lithium carbonate capsule 300 mg  300 mg Oral TID WC Antonieta Pert, MD      . LORazepam (ATIVAN) tablet 1  mg  1 mg Oral Q4H PRN Antonieta Pert, MD      . ziprasidone (GEODON) injection 20 mg  20 mg Intramuscular Q12H PRN Antonieta Pert, MD       And  . LORazepam (ATIVAN) tablet 1 mg  1 mg Oral PRN Antonieta Pert, MD      . magnesium hydroxide (MILK OF MAGNESIA) suspension 30 mL  30 mL Oral Daily PRN Antonieta Pert, MD      . QUEtiapine (SEROQUEL) tablet 200 mg  200 mg Oral QHS Antonieta Pert, MD      . traZODone (DESYREL) tablet 100 mg  100 mg Oral QHS PRN Antonieta Pert, MD       PTA Medications: Medications Prior to Admission  Medication Sig Dispense Refill Last Dose  . acyclovir (ZOVIRAX) 400 MG tablet Take 1 tablet (400 mg total) by mouth 3 (three) times  daily. (Patient not taking: Reported on 05/29/2018) 15 tablet 0 Completed Course at Unknown time  . gabapentin (NEURONTIN) 300 MG capsule Take 300 mg by mouth 3 (three) times daily.   05/28/2018 at Unknown time  . lithium carbonate 300 MG capsule Take 300 mg by mouth 3 (three) times daily with meals.   05/28/2018 at Unknown time  . methocarbamol (ROBAXIN) 500 MG tablet Take 1 tablet (500 mg total) by mouth 2 (two) times daily. (Patient not taking: Reported on 05/29/2018) 20 tablet 0 Completed Course at Unknown time  . oxyCODONE-acetaminophen (PERCOCET/ROXICET) 5-325 MG tablet Take 1 tablet by mouth every 8 (eight) hours as needed for severe pain. (Patient not taking: Reported on 05/29/2018) 8 tablet 0 Completed Course at Unknown time  . QUEtiapine (SEROQUEL) 200 MG tablet Take 200 mg by mouth at bedtime.   05/27/2018 at Unknown time    Musculoskeletal: Strength & Muscle Tone: within normal limits Gait & Station: normal Patient leans: N/A  Psychiatric Specialty Exam: Physical Exam  Nursing note and vitals reviewed. Constitutional: He is oriented to person, place, and time. He appears well-developed and well-nourished.  HENT:  Head: Normocephalic and atraumatic.  Respiratory: Effort normal.  Neurological: He is alert  and oriented to person, place, and time.    ROS  There were no vitals taken for this visit.There is no height or weight on file to calculate BMI.  General Appearance: Disheveled  Eye Contact:  Fair  Speech:  Slow  Volume:  Decreased  Mood:  Anxious and Depressed  Affect:  Congruent  Thought Process:  Coherent and Descriptions of Associations: Intact  Orientation:  Full (Time, Place, and Person)  Thought Content:  Delusions and Paranoid Ideation  Suicidal Thoughts:  Yes.  without intent/plan  Homicidal Thoughts:  Yes.  without intent/plan  Memory:  Immediate;   Fair Recent;   Fair Remote;   Fair  Judgement:  Intact  Insight:  Fair  Psychomotor Activity:  Decreased and Psychomotor Retardation  Concentration:  Concentration: Fair and Attention Span: Fair  Recall:  [employer]  Fund of Knowledge:  Fair  Language:  Fair  Akathisia:  Negative  Handed:  Right  AIMS (if indicated):     Assets:  Desire for Improvement Housing Physical Health Resilience  ADL's:  Intact  Cognition:  WNL  Sleep:       Treatment Plan Summary: Daily contact with patient to assess and evaluate symptoms and progress in treatment, Medication management and Plan : Patient is seen and examined.  Patient is a 58 year old male with the above-stated past psychiatric history who was admitted with suicidal and homicidal ideation as well as some paranoid thinking.  He will be admitted to the hospital.  He will be integrated into the milieu.  We have restarted his Seroquel and lithium.  I am going to hold off on any antidepressants until we get the lithium and Seroquel in his system.  I do not want to risk flipping him into a manic phase.  His vital signs are stable, he is afebrile.  The patient stated he did have a history of hypertension, but it appears to be stable at this point.  We will get some collateral information from his family, and it would be helpful to get the [facility].  We will try to get him  to sign the care everywhere release of information so that we can track down what may have been done in the past.  Observation Level/Precautions:  15 minute  checks  Laboratory:  Chemistry Profile  Psychotherapy:    Medications:    Consultations:    Discharge Concerns:    Estimated LOS:  Other:     Physician Treatment Plan for Primary Diagnosis: <principal problem not specified> Long Term Goal(s): Improvement in symptoms so as ready for discharge  Short Term Goals: Ability to identify changes in lifestyle to reduce recurrence of condition will improve, Ability to verbalize feelings will improve, Ability to disclose and discuss suicidal ideas, Ability to demonstrate self-control will improve, Ability to identify and develop effective coping behaviors will improve, Ability to maintain clinical measurements within normal limits will improve and Compliance with prescribed medications will improve  Physician Treatment Plan for Secondary Diagnosis: Active Problems:   Bipolar affective disorder, current episode manic (HCC)  Long Term Goal(s): Improvement in symptoms so as ready for discharge  Short Term Goals: Ability to identify changes in lifestyle to reduce recurrence of condition will improve, Ability to verbalize feelings will improve, Ability to disclose and discuss suicidal ideas, Ability to demonstrate self-control will improve, Ability to identify and develop effective coping behaviors will improve, Ability to maintain clinical measurements within normal limits will improve and Compliance with prescribed medications will improve  I certify that inpatient services furnished can reasonably be expected to improve the patient's condition.    Antonieta Pert, MD 1/3/20201:30 PM

## 2018-05-31 NOTE — ED Notes (Signed)
Breakfast Tray ordered  

## 2018-05-31 NOTE — Progress Notes (Signed)
D.  Pt pleasant but guarded on approach, no complaints voiced at this time.  Pt was positive for evening wrap up group, minimal but appropriate interaction on unit.  Pt denies SI/HI/AVH at this time.  A.  Support and encouragement offered, medication given as ordered  R.  Pt remains safe on the unit, will continue to monitor.

## 2018-05-31 NOTE — BHH Suicide Risk Assessment (Signed)
[facility] Admission Suicide Risk Assessment   Nursing information obtained from:    Demographic factors:    Current Mental Status:    Loss Factors:    Historical Factors:    Risk Reduction Factors:     Total Time spent with patient: 45 minutes Principal Problem: <principal problem not specified> Diagnosis:  Active Problems:   Bipolar affective disorder, current episode manic (HCC)  Subjective Data: Patient is seen and examined.  Patient is a 58 year old male with a past psychiatric history significant for schizoaffective disorder; depressive episode who presented to the [facility] emergency department on 05/29/2018 with suicidal and homicidal ideation.  Patient stated that he had been in prison for 29 years for murder.  He was released approximately 18 months ago.  He has had a difficult time adjusting to being outside of the prison setting.  He has [facility] and [facility].  Unfortunately he has been noncompliant with his medications.  He stated he had been on medications throughout his imprisonment.  He also admitted that he had been noncompliant with his medications while he was in prison.  [facility].  He had attempted to cut his left arm, hanging self and other methods.  He then returned to the [facility].  He was hospitalized at [facility] prison several times, and most recently had an inpatient hospitalization at [facility] shortly after he was discharged from prison.  He does not like being around others, and feels very uncomfortable.  He has not been able to adjust to being out of the population.  He did have a job, but had to quit because he became so nervous and anxious with all the people around him.  He denied any auditory or visual hallucinations.  He does have some paranoid thinking.  He stated he has been living  in his car recently because he could not stand being around people, and felt better in an enclosed area.  He did admit that the medications did help him when he took them.  His laboratories revealed a lithium level of 0.08, but negative drug screen, negative blood alcohol.  He denied any substance use.  Otherwise laboratories reviewed were all within normal limits.  He was admitted to the hospital for evaluation and stabilization.  Continued Clinical Symptoms:    The "Alcohol Use Disorders Identification Test", Guidelines for Use in Primary Care, Second Edition.  World Science writer [place]). Score between 0-7:  no or low risk or alcohol related problems. Score between 8-15:  moderate risk of alcohol related problems. Score between 16-19:  high risk of alcohol related problems. Score 20 or above:  warrants further diagnostic evaluation for alcohol dependence and treatment.   CLINICAL FACTORS:   Schizophrenia:   Depressive state   Musculoskeletal: Strength & Muscle Tone: within normal limits Gait & Station: normal Patient leans: N/A  Psychiatric Specialty Exam: Physical Exam  Nursing note and vitals reviewed. Constitutional: He is oriented to person, place, and time. He appears well-developed and well-nourished.  HENT:  Head: Normocephalic and atraumatic.  Respiratory: Effort normal.  Neurological: He is alert and oriented to person, place, and time.    ROS  There were no vitals taken for this visit.There is no height or weight on file to calculate BMI.  General Appearance: Disheveled  Eye Contact:  Fair  Speech:  Slow  Volume:  Decreased  Mood:  Anxious and Depressed  Affect:  Congruent  Thought Process:  Coherent and Descriptions of Associations: Intact  Orientation:  Full (Time, Place, and Person)  Thought Content:  Delusions and Paranoid Ideation  Suicidal Thoughts:  Yes.  with intent/plan  Homicidal Thoughts:  Yes.  without intent/plan  Memory:  Immediate;    Fair Recent;   Fair Remote;   Fair  Judgement:  Intact  Insight:  Fair  Psychomotor Activity:  Decreased  Concentration:  Concentration: Fair and Attention Span: Fair  Recall:  FiservFair  Fund of Knowledge:  Fair  Language:  Fair  Akathisia:  Negative  Handed:  Right  AIMS (if indicated):     Assets:  Desire for Improvement Housing Physical Health Resilience  ADL's:  Intact  Cognition:  WNL  Sleep:         COGNITIVE FEATURES THAT CONTRIBUTE TO RISK:  None    SUICIDE RISK:   Moderate:  Frequent suicidal ideation with limited intensity, and duration, some specificity in terms of plans, no associated intent, good self-control, limited dysphoria/symptomatology, some risk factors present, and identifiable protective factors, including available and accessible social support.  PLAN OF CARE: Patient is seen and examined.  Patient is a 58 year old male with the above-stated past psychiatric history who was admitted with suicidal and homicidal ideation as well as some paranoid thinking.  He will be admitted to the hospital.  He will be integrated into the milieu.  We have restarted his Seroquel and lithium.  I am going to hold off on any antidepressants until we get the lithium and Seroquel in his system.  I do not want to risk flipping him into a manic phase.  His vital signs are stable, he is afebrile.  The patient stated he did have a history of hypertension, but it appears to be stable at this point.  We will get some collateral information from his family, and it would be helpful to get the information from [facility].  We will try to get him to sign the care everywhere release of information so that we can track down what may have been done in the past.  I certify that inpatient services furnished can reasonably be expected to improve the patient's condition.   Antonieta PertGreg Lawson Naksh Radi, MD 05/31/2018, 1:23 PM

## 2018-05-31 NOTE — Tx Team (Signed)
Initial Treatment Plan 05/31/2018 6:43 PM Seth Bright RRN:[id number]    PATIENT STRESSORS: Educational concerns Financial difficulties   PATIENT STRENGTHS: Ability for insight Active sense of humor   PATIENT IDENTIFIED PROBLEMS: MDD with SI  Schizoaffective D/O  Bipolar D/O          " I don't feel like I belong here"  " I want to be by myself"     DISCHARGE CRITERIA:  Ability to meet basic life and health needs Adequate post-discharge living arrangements Improved stabilization in mood, thinking, and/or behavior  PRELIMINARY DISCHARGE PLAN: Attend aftercare/continuing care group  PATIENT/FAMILY INVOLVEMENT: This treatment plan has been presented to and reviewed with the patient, Seth Bright, and/or family member, .  The patient and family have been given the opportunity to ask questions and make suggestions.  Seth Brave, RN 05/31/2018, 6:43 PM

## 2018-05-31 NOTE — Progress Notes (Signed)
Pt accepted to [facility], Bed 407-2 Reola Calkins, NP is the accepting provider.  Landry Mellow, MD is the attending provider.  Call report to [phone]  [email or web address] ED notified.   Pt is Voluntary.  Pt may be transported by [facility]  Pt scheduled  to arrive at [facility] as soon as transport can be arranged.  Timmothy Euler. Kaylyn Lim, MSW, LCSWA Disposition Clinical Social Work [phone] (cell) [phone] (office)

## 2018-06-01 DIAGNOSIS — F312 Bipolar disorder, current episode manic severe with psychotic features: Secondary | ICD-10-CM

## 2018-06-01 DIAGNOSIS — G4700 Insomnia, unspecified: Secondary | ICD-10-CM

## 2018-06-01 DIAGNOSIS — F419 Anxiety disorder, unspecified: Secondary | ICD-10-CM

## 2018-06-01 LAB — TSH: TSH: 2.888 u[IU]/mL (ref 0.350–4.500)

## 2018-06-01 NOTE — Plan of Care (Signed)
  Problem: Coping: Goal: Ability to use eye contact when communicating with others will improve Outcome: Progressing   Problem: Coping: Goal: Will verbalize feelings Outcome: Progressing   D: Pt alert and oriented on the unit. Pt engaging with RN staff and other pts on the unit. Pt watched television and talked on the telephone for most of the day. Pt denies HI, A/VH, and endorses passive SI. Pt's affect was flat and mood depressed. Pt is pleasant and cooperative on the unit. Pt's goal for today is "being comfortable around different people." A: Education, support and encouragement provided, q15 minute safety checks remain in effect. Medications administered per MD orders. R: No reactions/side effects to medicine noted. Pt denies any concerns at this time, and verbally contracts for safety. Pt ambulating on the unit with no issues. Pt remains safe on and off the unit.

## 2018-06-01 NOTE — Progress Notes (Signed)
The patient rated his day as a 5 out of 10. He would not go into further detail except to say that he is dealing with some issues. His goal for tomorrow is to participate more in groups.
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
## 2018-06-01 NOTE — Progress Notes (Signed)
[facility] MD Progress Note  06/01/2018 3:46 PM Seth Bright  MRN:  [id number]  Subjective:  Seth Bright reports, "I came to the hospital because I was having SIHI. There were different unpleasant things going on in my life that messed up my thoughts. These bad thoughts started while I was in prison. I got out of prison 18 months ago. Trying to mix with the society was stressful. The thoughts worsened. I am feeling a little better today because the thoughts are slowing down some. I still have dreams about hurting people & myself. I'm still lying down in bed because being around people stresses me out. I just cannot do it today because I just got here. I will just take it one day at a time".  Per Md's admission evaluation: Patient is a 58 year old male with a past psychiatric history significant for schizoaffective disorder; depressive episode who presented to the [facility] emergency department on 05/29/2018 with suicidal and homicidal ideation. Patient stated that he had been in prison for 29 years for murder. He was released approximately 18 months ago. He has had a difficult time adjusting to being outside of the prison setting. He has been receiving [facility] and [facility]. Unfortunately he has been noncompliant with his medications. He stated he had been on medications throughout his imprisonment. He also admitted that he had been noncompliant with his medications while he was in prison. He admitted [facility] attempting [facility]. He had attempted to cut his left arm, hanging self and other methods. He then returned to the [facility] setting [facility] of [facility]. He was hospitalized at [facility] prison several times, and most recently had an inpatient hospitalization at [facility] shortly after he was discharged from prison. He does not like being around others, and feels very uncomfortable. He  has not been able to adjust to being out of the population.   Objective: Seth Bright is seen. Chart reviewed. The chart findings discussed with the treatment team. He is lying down in bed. He is making eye contact, however, his affect is constricted. He says he does not feel like coming out of his room & mixing with people as of yet because it stresses him out. He did say that his thoughts are slowing down some. He is complaining of what appear like PTSD related nightmares about hurting people & himself. He declines to try Minipress at this time. He wants to wait to try his current medications first. He does not appear to be responding to any internal stimuli. Seth Bright is in agreement to continue current plan of care as already in progress.  Principal Problem: Bipolar affective disorder, current episode manic (HCC)  Diagnosis: Principal Problem:   Bipolar affective disorder, current episode manic (HCC)  Total Time spent with patient: 25 minutes  Past Psychiatric History: See H&P  Past Medical History:  Past Medical History:  Diagnosis Date  . Alcohol abuse   . Bipolar 1 disorder (HCC)   . Herpes   . Hypertension   . PTSD (post-traumatic stress disorder)   . Schizo affective schizophrenia [facility])     Past Surgical History:  Procedure Laterality Date  . CARDIAC SURGERY     stent placement 2016   Family History: History reviewed. No pertinent family history.  Family Psychiatric  History: See H&P  Social History:  Social History   Substance and Sexual Activity  Alcohol Use No  .  Frequency: Never     Social History   Substance and Sexual Activity  Drug Use No    Social History   Socioeconomic History  . Marital status: Single    Spouse name: Not on file  . Number of children: Not on file  . Years of education: Not on file  . Highest education level: Not on file  Occupational History  . Not on file  Social Needs  . Financial resource strain: Not hard at all  . Food  insecurity:    Worry: Patient refused    Inability: Patient refused  . Transportation needs:    Medical: Yes    Non-medical: Yes  Tobacco Use  . Smoking status: Passive Smoke Exposure - Never Smoker  . Smokeless tobacco: Never Used  Substance and Sexual Activity  . Alcohol use: No    Frequency: Never  . Drug use: No  . Sexual activity: Not Currently  Lifestyle  . Physical activity:    Days per week: Patient refused    Minutes per session: Patient refused  . Stress: Not on file  Relationships  . Social connections:    Talks on phone: Patient refused    Gets together: Patient refused    Attends religious service: Patient refused    Active member of club or organization: Patient refused    Attends meetings of clubs or organizations: Patient refused    Relationship status: Patient refused  Other Topics Concern  . Not on file  Social History Narrative  . Not on file   Additional Social History:   Sleep: Good  Appetite:  Good  Current Medications: Current Facility-Administered Medications  Medication Dose Route Frequency Provider Last Rate Last Dose  . acetaminophen (TYLENOL) tablet 650 mg  650 mg Oral Q6H PRN Antonieta Pert, MD      . alum & mag hydroxide-simeth (MAALOX/MYLANTA) 200-200-20 MG/5ML suspension 30 mL  30 mL Oral Q4H PRN Antonieta Pert, MD      . gabapentin (NEURONTIN) capsule 300 mg  300 mg Oral TID Antonieta Pert, MD   300 mg at 06/01/18 1155  . hydrOXYzine (ATARAX/VISTARIL) tablet 50 mg  50 mg Oral TID PRN Antonieta Pert, MD      . lithium carbonate capsule 300 mg  300 mg Oral TID WC Antonieta Pert, MD   300 mg at 06/01/18 1155  . LORazepam (ATIVAN) tablet 1 mg  1 mg Oral Q4H PRN Antonieta Pert, MD   1 mg at 06/01/18 1155  . ziprasidone (GEODON) injection 20 mg  20 mg Intramuscular Q12H PRN Antonieta Pert, MD       And  . LORazepam (ATIVAN) tablet 1 mg  1 mg Oral PRN Antonieta Pert, MD      . magnesium hydroxide (MILK OF  MAGNESIA) suspension 30 mL  30 mL Oral Daily PRN Antonieta Pert, MD      . QUEtiapine (SEROQUEL) tablet 200 mg  200 mg Oral QHS Antonieta Pert, MD   200 mg at 05/31/18 2038  . traZODone (DESYREL) tablet 100 mg  100 mg Oral QHS PRN Antonieta Pert, MD   100 mg at 05/31/18 2038   Lab Results:  Results for orders placed or performed during the hospital encounter of 05/31/18 (from the past 48 hour(s))  TSH     Status: None   Collection Time: 06/01/18  7:33 AM  Result Value Ref Range   TSH 2.888 0.350 - 4.500 uIU/mL  Comment: Performed by a 3rd Generation assay with a functional sensitivity of <=0.01 uIU/mL. Performed at [facility], [address] [place], [place] [address]    Blood Alcohol level:  Lab Results  Component Value Date   ETH <10 05/28/2018   Metabolic Disorder Labs: No results found for: HGBA1C, MPG No results found for: PROLACTIN No results found for: CHOL, TRIG, HDL, CHOLHDL, VLDL, LDLCALC  Physical Findings: AIMS: Facial and Oral Movements Muscles of Facial Expression: None, normal Lips and Perioral Area: None, normal Jaw: None, normal Tongue: None, normal,Extremity Movements Upper (arms, wrists, hands, fingers): None, normal Lower (legs, knees, ankles, toes): None, normal, Trunk Movements Neck, shoulders, hips: None, normal, Overall Severity Severity of abnormal movements (highest score from questions above): None, normal Incapacitation due to abnormal movements: None, normal Patient's awareness of abnormal movements (rate only patient's report): No Awareness, Dental Status Current problems with teeth and/or dentures?: No Does patient usually wear dentures?: No  CIWA:  CIWA-Ar Total: 0 COWS:     Musculoskeletal: Strength & Muscle Tone: within normal limits Gait & Station: normal Patient leans: N/A  Psychiatric Specialty Exam: Physical Exam  ROS  Blood pressure 111/71, pulse 89, temperature 99.1 F (37.3 C), temperature  source Oral, resp. rate 17, height 5\' 5"  (1.651 m), weight 72.6 kg, SpO2 98 %.Body mass index is 26.63 kg/m.  General Appearance: Disheveled  Eye Contact:  Fair  Speech:  Slow  Volume:  Decreased  Mood:  Anxious and Depressed  Affect:  Congruent  Thought Process:  Coherent and Descriptions of Associations: Intact  Orientation:  Full (Time, Place, and Person)  Thought Content:  Delusions and Paranoid Ideation  Suicidal Thoughts:  Yes.  without intent/plan  Homicidal Thoughts:  Yes.  without intent/plan  Memory:  Immediate;   Fair Recent;   Fair Remote;   Fair  Judgement:  Intact  Insight:  Fair  Psychomotor Activity:  Decreased and Psychomotor Retardation  Concentration:  Concentration: Fair and Attention Span: Fair  Recall:  [employer]  Fund of Knowledge:  Fair  Language:  Fair  Akathisia:  Negative  Handed:  Right  AIMS (if indicated):     Assets:  Desire for Improvement Housing Physical Health Resilience  ADL's:  Intact  Cognition:  WNL  Sleep: 6.75      Treatment Plan Summary: Daily contact with patient to assess and evaluate symptoms and progress in treatment and Medication management.  Continue inpatient hospitalization.  Will continue today 06/01/2018 plan as below except where it is noted.  Mood control.    - Continue Seroquel 200 mg po Q hs.  Mood stabilization.    - Continue Lithium Carbonate 300 mg po tid.    - Lithium Level result on 05-29-18: 0.08 (low).  Agitation/anxiety.    - Continue gabapentin 300 mg po tid.    - Continue Vistaril 50 mg po tid prn.    - Continue Lorazepam 1 mg po Q 4 hours prn.  Insomnia.     - Continue Trazodone 50 mf po Q hs prn.  Psychosis protocols.    - Continue Geodon 20 mg IM Q 12 prn.      &    - Ativan 1 mg po as needed x 1 dose.  Patient to attend & participate in the group sessions.  Discharge disposition ongoing.  Armandina StammerAgnes Akaisha Truman, NP, PMHNP, FNP-BC 06/01/2018, 3:46 PM

## 2018-06-01 NOTE — B[facility] Group Notes (Signed)
LCSW Group Therapy Note  06/01/2018    10:00-11:00am   Type of Therapy and Topic:  Group Therapy: Early Messages Received About Anger  Participation Level:  Did Not Attend   Description of Group:   In this group, patients shared and discussed the early messages received in their lives about anger through parental or other adult modeling, teaching, repression, punishment, violence, and more.  Participants identified how those childhood lessons influence even now how they usually or often react when angered.  The group discussed that anger is a secondary emotion and what may be the underlying emotional themes that come out through anger outbursts or that are ignored through anger suppression.  Finally, as a group there was a conversation about the workbook's quote that "There is nothing wrong with anger; it is just a sign something needs to change."     Therapeutic Goals: 1. Patients will identify one or more childhood message about anger that they received and how it was taught to them. 2. Patients will discuss how these childhood experiences have influenced and continue to influence their own expression or repression of anger even today. 3. Patients will explore possible primary emotions that tend to fuel their secondary emotion of anger. 4. Patients will learn that anger itself is normal and cannot be eliminated, and that healthier coping skills can assist with resolving conflict rather than worsening situations.  Summary of Patient Progress:  N/A  Therapeutic Modalities:   Cognitive Behavioral Therapy Motivation Interviewing  Heela Heishman J Grossman-Orr  .  

## 2018-06-02 NOTE — Plan of Care (Signed)
  Problem: Coping: Goal: Ability to interact with others will improve Outcome: Progressing Goal: Ability to use eye contact when communicating with others will improve Outcome: Progressing   D: Pt alert and oriented on the unit and denies SI/HI, A/VH. Pt has been actively engaging with other pts in group activities today in the day room. Pt is pleasant and cooperative. A: Education, support and encouragement provided, q15 minute safety checks remain in effect. Medications administered per MD orders. R: No reactions/side effects to medicine noted. Pt denies any concerns at this time, and verbally contracts for safety. Pt ambulating on the unit with no issues. Pt remains safe on and off the unit.

## 2018-06-02 NOTE — BHH Group Notes (Signed)
B[facility] Group Notes:  (Nursing/MHT/Case Management/Adjunct)  Date:  06/02/2018  Time:  4:15 pm  Type of Therapy:  Psychoeducational Skills  Participation Level:  Active  Participation Quality:  Appropriate  Affect:  Appropriate  Cognitive:  Appropriate  Insight:  Appropriate  Engagement in Group:  Engaged  Modes of Intervention:  Discussion  Summary of Progress/Problems:  Patient was alert and active during group.   Seth Bright 06/02/2018, 6:57 PM

## 2018-06-02 NOTE — Progress Notes (Signed)
Adult Psychoeducational Group Note  Date:  06/02/2018 Time:  10:42 AM  Group Topic/Focus:  Goals Group:   The focus of this group is to help patients establish daily goals to achieve during treatment and discuss how the patient can incorporate goal setting into their daily lives to aide in recovery. Orientation:   The focus of this group is to educate the patient on the purpose and policies of crisis stabilization and provide a format to answer questions about their admission.  The group details unit policies and expectations of patients while admitted.  Participation Level:  Minimal  Participation Quality:  Appropriate and Drowsy  Affect:  Depressed  Cognitive:  Lacking  Insight: Limited  Engagement in Group:  Limited  Modes of Intervention:  Discussion and Education  Additional Comments:   Pt attended group with the MHT. Pt appeared to be drowsy. When asked what his goal for today is Pt responded that he doesn't have one and he doesn't know. This Clinical research associate asked what he would like help with. Pt responded with I don't know. Pt shared that he was in prison and was released in 2018. Pt lived with his sister when he got out, but decided to leave and live in his car. Pt states that he chose to leave because he likes to be alone.      Karren Cobble 06/02/2018, 10:42 AM

## 2018-06-02 NOTE — Progress Notes (Signed)
[facility] MD Progress Note  06/02/2018 1:28 PM Seth Bright  MRN:  [id number]  Subjective:  Seth Bright reports, "I spent 29 years in prison for a murder charge. I came out 18 months ago. I'm really having a hard time to adjust to this real world. While in prison, I do know what to do, when & how to do it. This real world has been a challenge. I Can't seem to know how to adjust. Sometimes, I feel like going back to the prison. I'm also trying to get disability, they have denied me once or so. I stopped taking my medicines for a while. I really do not know what to do".  Per Md's admission evaluation: Patient is a 58 year old male with a past psychiatric history significant for schizoaffective disorder; depressive episode who presented to the [facility] emergency department on 05/29/2018 with suicidal and homicidal ideation. Patient stated that he had been in prison for 29 years for murder. He was released approximately 18 months ago. He has had a difficult time adjusting to being outside of the prison setting. He has been receiving [facility] the [facility] and [facility]. Unfortunately he has been noncompliant with his medications. He stated he had been on medications throughout his imprisonment. He also admitted that he had been noncompliant with his medications while he was in prison. He admitted [facility] attempting [facility]. He had attempted to cut his left arm, hanging self and other methods. He then returned to the [facility] setting [facility]. He was hospitalized at [facility] prison several times, and most recently had an inpatient hospitalization at [facility] shortly after he was discharged from prison. He does not like being around others, and feels very uncomfortable. He has not been able to adjust to being out of the population.   Objective: Seth Bright is seen. Chart reviewed. The chart  findings discussed with the treatment team. He is sitting down on his bed. He is making fair eye contact today, however, his affect is constricted. He says he does not feel like coming out of his room & mixing with people as of yet because it stresses him out. However, he was able to to go to the cafeteria with the other patients for meal. He did spend some casual time in the dayroom after lunch this afternoon. He did say that his thoughts are slowing down some.  He does not appear to be responding to any internal stimuli. He keeps to himself. Seth Bright is in agreement to continue current plan of care as already in progress.  Principal Problem: Bipolar affective disorder, current episode manic (HCC)  Diagnosis: Principal Problem:   Bipolar affective disorder, current episode manic (HCC)  Total Time spent with patient: 15 minutes  Past Psychiatric History: See H&P  Past Medical History:  Past Medical History:  Diagnosis Date  . Alcohol abuse   . Bipolar 1 disorder (HCC)   . Herpes   . Hypertension   . PTSD (post-traumatic stress disorder)   . Schizo affective schizophrenia [facility])     Past Surgical History:  Procedure Laterality Date  . CARDIAC SURGERY     stent placement 2016   Family History: History reviewed. No pertinent family history.  Family Psychiatric  History: See H&P  Social History:  Social History   Substance and Sexual Activity  Alcohol Use No  . Frequency: Never     Social History  Substance and Sexual Activity  Drug Use No    Social History   Socioeconomic History  . Marital status: Single    Spouse name: Not on file  . Number of children: Not on file  . Years of education: Not on file  . Highest education level: Not on file  Occupational History  . Not on file  Social Needs  . Financial resource strain: Not hard at all  . Food insecurity:    Worry: Patient refused    Inability: Patient refused  . Transportation needs:    Medical: Yes     Non-medical: Yes  Tobacco Use  . Smoking status: Passive Smoke Exposure - Never Smoker  . Smokeless tobacco: Never Used  Substance and Sexual Activity  . Alcohol use: No    Frequency: Never  . Drug use: No  . Sexual activity: Not Currently  Lifestyle  . Physical activity:    Days per week: Patient refused    Minutes per session: Patient refused  . Stress: Not on file  Relationships  . Social connections:    Talks on phone: Patient refused    Gets together: Patient refused    Attends religious service: Patient refused    Active member of club or organization: Patient refused    Attends meetings of clubs or organizations: Patient refused    Relationship status: Patient refused  Other Topics Concern  . Not on file  Social History Narrative  . Not on file   Additional Social History:   Sleep: Good  Appetite:  Good  Current Medications: Current Facility-Administered Medications  Medication Dose Route Frequency Provider Last Rate Last Dose  . acetaminophen (TYLENOL) tablet 650 mg  650 mg Oral Q6H PRN Antonieta Pertlary, Greg Lawson, MD      . alum & mag hydroxide-simeth (MAALOX/MYLANTA) 200-200-20 MG/5ML suspension 30 mL  30 mL Oral Q4H PRN Antonieta Pertlary, Greg Lawson, MD      . gabapentin (NEURONTIN) capsule 300 mg  300 mg Oral TID Antonieta Pertlary, Greg Lawson, MD   300 mg at 06/02/18 1300  . hydrOXYzine (ATARAX/VISTARIL) tablet 50 mg  50 mg Oral TID PRN Antonieta Pertlary, Greg Lawson, MD      . lithium carbonate capsule 300 mg  300 mg Oral TID WC Antonieta Pertlary, Greg Lawson, MD   300 mg at 06/02/18 1300  . LORazepam (ATIVAN) tablet 1 mg  1 mg Oral Q4H PRN Antonieta Pertlary, Greg Lawson, MD   1 mg at 06/01/18 2141  . ziprasidone (GEODON) injection 20 mg  20 mg Intramuscular Q12H PRN Antonieta Pertlary, Greg Lawson, MD       And  . LORazepam (ATIVAN) tablet 1 mg  1 mg Oral PRN Antonieta Pertlary, Greg Lawson, MD      . magnesium hydroxide (MILK OF MAGNESIA) suspension 30 mL  30 mL Oral Daily PRN Antonieta Pertlary, Greg Lawson, MD      . QUEtiapine (SEROQUEL) tablet 200 mg  200  mg Oral QHS Antonieta Pertlary, Greg Lawson, MD   200 mg at 06/01/18 2141  . traZODone (DESYREL) tablet 100 mg  100 mg Oral QHS PRN Antonieta Pertlary, Greg Lawson, MD   100 mg at 06/01/18 2141   Lab Results:  Results for orders placed or performed during the hospital encounter of 05/31/18 (from the past 48 hour(s))  TSH     Status: None   Collection Time: 06/01/18  7:33 AM  Result Value Ref Range   TSH 2.888 0.350 - 4.500 uIU/mL    Comment: Performed by a 3rd Generation assay with  a functional sensitivity of <=0.01 uIU/mL. Performed at [facility], [address] [place], [place] [address]    Blood Alcohol level:  Lab Results  Component Value Date   ETH <10 05/28/2018   Metabolic Disorder Labs: No results found for: HGBA1C, MPG No results found for: PROLACTIN No results found for: CHOL, TRIG, HDL, CHOLHDL, VLDL, LDLCALC  Physical Findings: AIMS: Facial and Oral Movements Muscles of Facial Expression: None, normal Lips and Perioral Area: None, normal Jaw: None, normal Tongue: None, normal,Extremity Movements Upper (arms, wrists, hands, fingers): None, normal Lower (legs, knees, ankles, toes): None, normal, Trunk Movements Neck, shoulders, hips: None, normal, Overall Severity Severity of abnormal movements (highest score from questions above): None, normal Incapacitation due to abnormal movements: None, normal Patient's awareness of abnormal movements (rate only patient's report): No Awareness, Dental Status Current problems with teeth and/or dentures?: No Does patient usually wear dentures?: No  CIWA:  CIWA-Ar Total: 0 COWS:     Musculoskeletal: Strength & Muscle Tone: within normal limits Gait & Station: normal Patient leans: N/A  Psychiatric Specialty Exam: Physical Exam  ROS  Blood pressure 130/68, pulse 78, temperature 98.4 F (36.9 C), temperature source Oral, resp. rate 17, height 5\' 5"  (1.651 m), weight 72.6 kg, SpO2 98 %.Body mass index is 26.63 kg/m.  General  Appearance: Disheveled  Eye Contact:  Fair  Speech:  Slow  Volume:  Decreased  Mood:  Anxious and Depressed  Affect:  Congruent  Thought Process:  Coherent and Descriptions of Associations: Intact  Orientation:  Full (Time, Place, and Person)  Thought Content:  Delusions and Paranoid Ideation  Suicidal Thoughts:  Yes.  without intent/plan  Homicidal Thoughts:  Yes.  without intent/plan  Memory:  Immediate;   Fair Recent;   Fair Remote;   Fair  Judgement:  Intact  Insight:  Fair  Psychomotor Activity:  Decreased and Psychomotor Retardation  Concentration:  Concentration: Fair and Attention Span: Fair  Recall:  [employer] of Knowledge:  Fair  Language:  Fair  Akathisia:  Negative  Handed:  Right  AIMS (if indicated):     Assets:  Desire for Improvement Housing Physical Health Resilience  ADL's:  Intact  Cognition:  WNL  Sleep: 6.75      Treatment Plan Summary: Daily contact with patient to assess and evaluate symptoms and progress in treatment and Medication management.  Continue inpatient hospitalization.  Will continue today 06/02/2018 plan as below except where it is noted.  Mood control.    - Continue Seroquel 200 mg po Q hs.  Mood stabilization.    - Continue Lithium Carbonate 300 mg po tid.    - Lithium Level result on 05-29-18: 0.08 (low).  Agitation/anxiety.    - Continue gabapentin 300 mg po tid.    - Continue Vistaril 50 mg po tid prn.    - Continue Lorazepam 1 mg po Q 4 hours prn.  Insomnia.     - Continue Trazodone 50 mf po Q hs prn.  Psychosis protocols.    - Continue Geodon 20 mg IM Q 12 prn.      &    - Ativan 1 mg po as needed x 1 dose.  Patient to attend & participate in the group sessions.  Discharge disposition ongoing.  Armandina Stammer, NP, PMHNP, FNP-BC 06/02/2018, 1:28 PMPatient ID: Seth Bright, male   DOB: Jul 09, 1960, 58 y.o.   MRN: [id number]

## 2018-06-02 NOTE — [facility] Group Notes (Signed)
[place] LCSW Group Therapy Note  06/02/2018  10:00-11:00AM  Type of Therapy and Topic:  Group Therapy:  Adding Supports Including Being Your Own Support  Participation Level:  Active   Description of Group:  Patients in this group were introduced to the concept that additional supports including self-support are an essential part of recovery.  A song entitled "Breaking Down" was played and a group discussion was held in reaction to the idea of needing to add supports.  A song entitled "My Own Hero" was played and a group discussion ensued in which patients stated they could relate to the song and it inspired them to realize they have be willing to help themselves in order to succeed, because other people cannot achieve sobriety or stability for them.  We discussed adding a variety of healthy supports to address the various needs in their lives.    Therapeutic Goals: 1)  demonstrate the importance of being a part of one's own support system 2)  discuss reasons people in one's life may eventually be unable to be continually supportive  3)  identify the patient's current support system and   4)  elicit commitments to add healthy supports and to become more conscious of being self-supportive   Summary of Patient Progress:  The patient expressed that he feels himself to be "an emotional wreck."  He talked about his impulsive behavior that leads him to quit jobs without cause, and told of even driving to [facility] suddenly to be with a girl he met on-line, then driving back when it was a bad situation.  He talked of spending 29 years in prison and how that has affected him, and said he has had about 10 jobs since then, has quit most of them suddenly.   Therapeutic Modalities:   Motivational Interviewing Activity  Maretta Los

## 2018-06-02 NOTE — Progress Notes (Signed)
Pt rates day a five.  "Nothing good or bad happened".  Pt goal is to organize his thought process and control his mood swings.  Pt sts difficult readjustment to life out of prison having been incarcerated for 22 years.  t denies SI, HI and AVH and contracts for safety verbally.  Pt attends group and participates and is medication compliant. Pt denies pain or discomfort.  Pt given education, support and encouragement.   Pt remains safe on unit

## 2018-06-03 DIAGNOSIS — F4310 Post-traumatic stress disorder, unspecified: Secondary | ICD-10-CM

## 2018-06-03 MED ORDER — SERTRALINE HCL 50 MG PO TABS
50.0000 mg | ORAL_TABLET | Freq: Every day | ORAL | Status: DC
  Administered 2018-06-04 – 2018-06-07 (×4): 50 mg via ORAL
  Filled 2018-06-03 (×6): qty 1

## 2018-06-03 MED ORDER — HYDROXYZINE HCL 25 MG PO TABS: 25.0000 mg | ORAL_TABLET | Freq: Three times a day (TID) | ORAL | Status: DC | PRN

## 2018-06-03 NOTE — Progress Notes (Signed)
Patient self inventory- Patient slept well last night, sleep medication was taken and it was helpful. Appetite is good, energy level low, concentration poor. Depression, hopelessness, and anxiety rated 6, 6, 5 out of 10.  Denies withdrawal symptoms, endorses SI "almost all the time." Patient has tooth pain 2/10. Patient's goal is to "stop feeling so paranoid."  Patient is compliant with medications prescribed per provider. No side effects noted.  Safety is maintained with 15 minute checks as well as environmental checks. Will continue to monitor and provide support.
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
## 2018-06-03 NOTE — Progress Notes (Signed)
Adult Psychoeducational Group Note  Date:  06/03/2018 Time:  12:09 AM  Group Topic/Focus:  Wrap-Up Group:   The focus of this group is to help patients review their daily goal of treatment and discuss progress on daily workbooks.  Participation Level:  Active  Participation Quality:  Appropriate  Affect:  Appropriate  Cognitive:  Appropriate  Insight: Appropriate  Engagement in Group:  Engaged  Modes of Intervention:  Discussion  Additional Comments:  Patient attended group and said that his day was a 5.  He said his word for today is determination.   Dailynn Nancarrow W Seydou Hearns 06/03/2018, 12:09 AM

## 2018-06-03 NOTE — Tx Team (Signed)
Interdisciplinary Treatment and Diagnostic Plan Update  06/03/2018 Time of Session:  Seth Bright MRN: [id number]  Principal Diagnosis: Bipolar affective disorder, current episode manic (HCC)  Secondary Diagnoses: Principal Problem:   Bipolar affective disorder, current episode manic (HCC)   Current Medications:  Current Facility-Administered Medications  Medication Dose Route Frequency Provider Last Rate Last Dose  . acetaminophen (TYLENOL) tablet 650 mg  650 mg Oral Q6H PRN Antonieta Pertlary, Greg Lawson, MD      . alum & mag hydroxide-simeth (MAALOX/MYLANTA) 200-200-20 MG/5ML suspension 30 mL  30 mL Oral Q4H PRN Antonieta Pertlary, Greg Lawson, MD      . gabapentin (NEURONTIN) capsule 300 mg  300 mg Oral TID Antonieta Pertlary, Greg Lawson, MD   300 mg at 06/03/18 [id number]  . hydrOXYzine (ATARAX/VISTARIL) tablet 50 mg  50 mg Oral TID PRN Antonieta Pertlary, Greg Lawson, MD      . lithium carbonate capsule 300 mg  300 mg Oral TID WC Antonieta Pertlary, Greg Lawson, MD   300 mg at 06/03/18 0631  . LORazepam (ATIVAN) tablet 1 mg  1 mg Oral Q4H PRN Antonieta Pertlary, Greg Lawson, MD   1 mg at 06/03/18 0736  . ziprasidone (GEODON) injection 20 mg  20 mg Intramuscular Q12H PRN Antonieta Pertlary, Greg Lawson, MD       And  . LORazepam (ATIVAN) tablet 1 mg  1 mg Oral PRN Antonieta Pertlary, Greg Lawson, MD      . magnesium hydroxide (MILK OF MAGNESIA) suspension 30 mL  30 mL Oral Daily PRN Antonieta Pertlary, Greg Lawson, MD      . QUEtiapine (SEROQUEL) tablet 200 mg  200 mg Oral QHS Antonieta Pertlary, Greg Lawson, MD   200 mg at 06/02/18 2142  . traZODone (DESYREL) tablet 100 mg  100 mg Oral QHS PRN Antonieta Pertlary, Greg Lawson, MD   100 mg at 06/02/18 2142   PTA Medications: Medications Prior to Admission  Medication Sig Dispense Refill Last Dose  . acyclovir (ZOVIRAX) 400 MG tablet Take 1 tablet (400 mg total) by mouth 3 (three) times daily. (Patient not taking: Reported on 05/29/2018) 15 tablet 0 Completed Course at Unknown time  . gabapentin (NEURONTIN) 300 MG capsule Take 300 mg by mouth 3 (three) times daily.    05/28/2018 at Unknown time  . lithium carbonate 300 MG capsule Take 300 mg by mouth 3 (three) times daily with meals.   05/28/2018 at Unknown time  . methocarbamol (ROBAXIN) 500 MG tablet Take 1 tablet (500 mg total) by mouth 2 (two) times daily. (Patient not taking: Reported on 05/29/2018) 20 tablet 0 Completed Course at Unknown time  . oxyCODONE-acetaminophen (PERCOCET/ROXICET) 5-325 MG tablet Take 1 tablet by mouth every 8 (eight) hours as needed for severe pain. (Patient not taking: Reported on 05/29/2018) 8 tablet 0 Completed Course at Unknown time  . QUEtiapine (SEROQUEL) 200 MG tablet Take 200 mg by mouth at bedtime.   05/27/2018 at Unknown time    Patient Stressors: Educational concerns Financial difficulties  Patient Strengths: Ability for insight Active sense of humor  Treatment Modalities: Medication Management, Group therapy, Case management,  1 to 1 session with clinician, Psychoeducation, Recreational therapy.   Physician Treatment Plan for Primary Diagnosis: Bipolar affective disorder, current episode manic (HCC) Long Term Goal(s): Improvement in symptoms so as ready for discharge Improvement in symptoms so as ready for discharge   Short Term Goals: Ability to identify changes in lifestyle to reduce recurrence of condition will improve Ability to verbalize feelings will improve Ability to disclose and discuss suicidal ideas Ability  to demonstrate self-control will improve Ability to identify and develop effective coping behaviors will improve Ability to maintain clinical measurements within normal limits will improve Compliance with prescribed medications will improve Ability to identify changes in lifestyle to reduce recurrence of condition will improve Ability to verbalize feelings will improve Ability to disclose and discuss suicidal ideas Ability to demonstrate self-control will improve Ability to identify and develop effective coping behaviors will improve Ability to  maintain clinical measurements within normal limits will improve Compliance with prescribed medications will improve  Medication Management: Evaluate patient's response, side effects, and tolerance of medication regimen.  Therapeutic Interventions: 1 to 1 sessions, Unit Group sessions and Medication administration.  Evaluation of Outcomes: Progressing  Physician Treatment Plan for Secondary Diagnosis: Principal Problem:   Bipolar affective disorder, current episode manic (HCC)  Long Term Goal(s): Improvement in symptoms so as ready for discharge Improvement in symptoms so as ready for discharge   Short Term Goals: Ability to identify changes in lifestyle to reduce recurrence of condition will improve Ability to verbalize feelings will improve Ability to disclose and discuss suicidal ideas Ability to demonstrate self-control will improve Ability to identify and develop effective coping behaviors will improve Ability to maintain clinical measurements within normal limits will improve Compliance with prescribed medications will improve Ability to identify changes in lifestyle to reduce recurrence of condition will improve Ability to verbalize feelings will improve Ability to disclose and discuss suicidal ideas Ability to demonstrate self-control will improve Ability to identify and develop effective coping behaviors will improve Ability to maintain clinical measurements within normal limits will improve Compliance with prescribed medications will improve     Medication Management: Evaluate patient's response, side effects, and tolerance of medication regimen.  Therapeutic Interventions: 1 to 1 sessions, Unit Group sessions and Medication administration.  Evaluation of Outcomes: Progressing   RN Treatment Plan for Primary Diagnosis: Bipolar affective disorder, current episode manic (HCC) Long Term Goal(s): Knowledge of disease and therapeutic regimen to maintain health will  improve  Short Term Goals: Ability to participate in decision making will improve, Ability to verbalize feelings will improve, Ability to disclose and discuss suicidal ideas and Ability to identify and develop effective coping behaviors will improve  Medication Management: RN will administer medications as ordered by provider, will assess and evaluate patient's response and provide education to patient for prescribed medication. RN will report any adverse and/or side effects to prescribing provider.  Therapeutic Interventions: 1 on 1 counseling sessions, Psychoeducation, Medication administration, Evaluate responses to treatment, Monitor vital signs and CBGs as ordered, Perform/monitor CIWA, COWS, AIMS and Fall Risk screenings as ordered, Perform wound care treatments as ordered.  Evaluation of Outcomes: Progressing   LCSW Treatment Plan for Primary Diagnosis: Bipolar affective disorder, current episode manic (HCC) Long Term Goal(s): Safe transition to appropriate next level of care at discharge, Engage patient in therapeutic group addressing interpersonal concerns.  Short Term Goals: Engage patient in aftercare planning with referrals and resources  Therapeutic Interventions: Assess for all discharge needs, 1 to 1 time with Social worker, Explore available resources and support systems, Assess for adequacy in community support network, Educate family and significant other(s) on suicide prevention, Complete Psychosocial Assessment, Interpersonal group therapy.  Evaluation of Outcomes: Progressing   Progress in Treatment: Attending groups: Yes. Participating in groups: Yes. Taking medication as prescribed: Yes. Toleration medication: Yes. Family/Significant other contact made: No, will contact:  the patient's friend Patient understands diagnosis: Yes. Discussing patient identified problems/goals with staff: Yes. Medical problems stabilized  or resolved: Yes. Denies suicidal/homicidal  ideation: Yes. Issues/concerns per patient self-inventory: No. Other:  New problem(s) identified: None   New Short Term/Long Term Goal(s): medication stabilization, elimination of SI thoughts, development of comprehensive mental wellness plan.    Patient Goals:    Discharge Plan or Barriers: CSW will continue to assess for appropriate referrals and possible discharge planning.   Reason for Continuation of Hospitalization: Anxiety Depression Homicidal ideation Medication stabilization Suicidal ideation  Estimated Length of Stay: 2-3 days   Attendees: Patient: 06/03/2018 11:22 AM  Physician: Dr. Nehemiah MassedFernando Cobos, MD 06/03/2018 11:22 AM  Nursing: Arlyss RepressAlyssa.Leonard SchwartzB, RN 06/03/2018 11:22 AM  RN Care Manager: 06/03/2018 11:22 AM  Social Worker: Baldo DaubJolan Retina Bernardy, LCSWA 06/03/2018 11:22 AM  Recreational Therapist:  06/03/2018 11:22 AM  Other: Beverly GustJane Sykes, NP  06/03/2018 11:22 AM  Other:  06/03/2018 11:22 AM  Other: 06/03/2018 11:22 AM    Scribe for Treatment Team: Maeola SarahJolan E Manhattan Mccuen, LCSWA 06/03/2018 11:22 AM

## 2018-06-03 NOTE — Plan of Care (Signed)
  Problem: Activity: Goal: Will identify at least one activity in which they can participate Outcome: Progressing   Problem: Coping: Goal: Ability to identify and develop effective coping behavior will improve Outcome: Progressing Goal: Ability to interact with others will improve Outcome: Progressing Goal: Demonstration of participation in decision-making regarding own care will improve Outcome: Progressing Goal: Ability to use eye contact when communicating with others will improve Outcome: Progressing

## 2018-06-03 NOTE — Progress Notes (Signed)
Recreation Therapy Notes  Date: 1.6.20 Time: 0930 Location: [address] Dayroom  Group Topic: Stress Management  Goal Area(s) Addresses:  Patient will engage in healthy stress management techniques. Patient will be able to identify positive stress management techniques.  Intervention: Stress Management  Activity : Meditation.  LRT introduced the stress management technique of meditation.  LRT played a meditation that focused on looking at each day as a new beginning.  Patients were to follow along as meditation was played in order to engage in activity.  Education:  Stress Management, Discharge Planning.   Education Outcome: Acknowledges Education  Clinical Observations/Feedback: Pt did not attend group.    Caroll RancherMarjette Fredy Gladu, LRT/CTRS         Caroll RancherLindsay, Yamin Swingler A 06/03/2018 10:49 AM

## 2018-06-03 NOTE — Progress Notes (Signed)
Pt continues to voice difficulty being around other people of in groups.  Pt prefers confined area.  Pt sts he has anxiety and prefers to isolate.  Pt makes poor eye contact and sometimes is hard to understand.  Pt currently denies pain, discomfort and SI, HI and AVH.  Pt contracts for safety verbally. Pt takes medication for anxiety and sleep Pt remains safe on unit

## 2018-06-03 NOTE — [facility] Group Notes (Signed)
LCSW Group Therapy Note 06/03/2018 11:35 AM  Type of Therapy and Topic: Group Therapy: Overcoming Obstacles  Participation Level: Minimal  Description of Group:  In this group patients will be encouraged to explore what they see as obstacles to their own wellness and recovery. They will be guided to discuss their thoughts, feelings, and behaviors related to these obstacles. The group will process together ways to cope with barriers, with attention given to specific choices patients can make. Each patient will be challenged to identify changes they are motivated to make in order to overcome their obstacles. This group will be process-oriented, with patients participating in exploration of their own experiences as well as giving and receiving support and challenge from other group members.  Therapeutic Goals: 1. Patient will identify personal and current obstacles as they relate to admission. 2. Patient will identify barriers that currently interfere with their wellness or overcoming obstacles.  3. Patient will identify feelings, thought process and behaviors related to these barriers. 4. Patient will identify two changes they are willing to make to overcome these obstacles:   Summary of Patient Progress  Morey was attentive during the group session, however he frequently fell asleep. Jehan reports that he does not know what his main obstacles are, however he know that they are there.    Therapeutic Modalities:  Cognitive Behavioral Therapy Solution Focused Therapy Motivational Interviewing Relapse Prevention Therapy   Alcario DroughtJolan Jaime Dome LCSWA Clinical Social Worker

## 2018-06-04 LAB — LIPID PANEL
CHOLESTEROL: 240 mg/dL — AB (ref 0–200)
HDL: 49 mg/dL (ref >40)
LDL Cholesterol: 166 mg/dL — ABNORMAL HIGH (ref 0–99)
Total CHOL/HDL Ratio: 4.9 RATIO
Triglycerides: 127 mg/dL (ref <150)
VLDL: 25 mg/dL (ref 0–40)

## 2018-06-04 LAB — LITHIUM LEVEL: Lithium Lvl: 0.39 mmol/L — ABNORMAL LOW (ref 0.60–1.20)

## 2018-06-04 LAB — HEMOGLOBIN A1C
Hgb A1c MFr Bld: 6 % — ABNORMAL HIGH (ref 4.8–5.6)
MEAN PLASMA GLUCOSE: 125.5 mg/dL

## 2018-06-04 MED ORDER — TRAZODONE HCL 50 MG PO TABS
50.0000 mg | ORAL_TABLET | Freq: Every evening | ORAL | Status: DC | PRN
  Administered 2018-06-04 – 2018-06-06 (×3): 50 mg via ORAL
  Filled 2018-06-04: qty 1
  Filled 2018-06-04: qty 7

## 2018-06-04 MED ORDER — LITHIUM CARBONATE 300 MG PO CAPS
600.0000 mg | ORAL_CAPSULE | Freq: Every day | ORAL | Status: DC
  Administered 2018-06-04 – 2018-06-06 (×3): 600 mg via ORAL
  Filled 2018-06-04 (×4): qty 2

## 2018-06-04 MED ORDER — LITHIUM CARBONATE 300 MG PO CAPS
300.0000 mg | ORAL_CAPSULE | ORAL | Status: DC
  Administered 2018-06-05 – 2018-06-07 (×3): 300 mg via ORAL
  Filled 2018-06-04 (×3): qty 1
  Filled 2018-06-04: qty 21
  Filled 2018-06-04: qty 1
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
## 2018-06-04 NOTE — Plan of Care (Signed)
Progress note  D: pt found in the dayroom; compliant with medication administration. Pt states he slept well. Pt rates his depression/hopelessness/anxiety a 4/4/4 out of 10 respectively. Pt is suffering from agitation and irritably, but denies any physical symptoms, rating his pain a 0/10. Pt states his goal for today is to build self confidence, and he will achieve this by joining in group. Pt denies any si/hi/ah/vh and verbally agrees to approach staff if these become apparent or before harming himself or others while at [facility]  A: pt provided support and encouragement. Pt given medication per protocol and standing orders. Q20m safety checks implemented and continued. R: pt safe on the unit. Will continue to monitor.   Pt progressing in the following metrics  Problem: Health Behavior/Discharge Planning: Goal: Identification of resources available to assist in meeting health care needs will improve Outcome: Progressing   Problem: Self-Concept: Goal: Will verbalize positive feelings about self Outcome: Progressing   Problem: Education: Goal: Knowledge of the prescribed therapeutic regimen will improve Outcome: Progressing   Problem: Activity: Goal: Interest or engagement in leisure activities will improve Outcome: Progressing

## 2018-06-04 NOTE — Progress Notes (Signed)
D: Pt denies SI/HI/AVH. Pt is pleasant and cooperative. Pt stated he talked to the doctor and SW about things he needed to get done. Pt appeared to be in better spirits about his future.   A: Pt was offered support and encouragement. Pt was given scheduled medications. Pt was encourage to attend groups. Q 15 minute checks were done for safety.   R:Pt attends groups and interacts well with peers and staff. Pt is taking medication. Pt has no complaints.Pt receptive to treatment and safety maintained on unit.   Problem: Coping: Goal: Ability to interact with others will improve Outcome: Progressing   Problem: Self-Concept: Goal: Will verbalize positive feelings about self Outcome: Progressing   Problem: Education: Goal: Knowledge of the prescribed therapeutic regimen will improve Outcome: Progressing   Problem: Activity: Goal: Interest or engagement in leisure activities will improve Outcome: Progressing

## 2018-06-04 NOTE — Progress Notes (Signed)
D: Pt passive SI-contracts for safety denies HI/AVH. Pt is pleasant and cooperative. Pt upset due to not being able to adjust to the outside. Pt was encouraged to get in support groups and find a therapist to talk to . Pt encouraged to find possible solutions to him not being able to work around people, finding a job he can work alone, find out about possible disability , or whatever may be the best solution for his specific case.   A: Pt was offered support and encouragement. Pt was given scheduled medications. Pt was encourage to attend groups. Q 15 minute checks were done for safety.  R:Pt attends groups and interacts well with peers and staff. Pt is taking medication. Pt receptive to treatment and safety maintained on unit.

## 2018-06-04 NOTE — [facility] Group Notes (Signed)
Adult Psychoeducational Group Note  Date:  06/04/2018 Time:  9:06 AM  Group Topic/Focus:  Orientation:   The focus of this group is to educate the patient on the purpose and policies of crisis stabilization and provide a format to answer questions about their admission.  The group details unit policies and expectations of patients while admitted.  Participation Level:  Active  Participation Quality:  Appropriate  Affect:  Appropriate  Cognitive:  Appropriate  Insight: Appropriate  Engagement in Group:  Improving  Modes of Intervention:  Orientation  Additional Comments:   Pt participated in orientation group facilitated by MHT Macdilla W.  Dellia Nims 06/04/2018, 9:06 AM

## 2018-06-04 NOTE — Progress Notes (Signed)
[facility] Group Notes:  (Nursing/MHT/Case Management/Adjunct)  Date:  06/04/2018  Time:  1515  Type of Therapy:  Nurse Education  Participation Level:  Active  Participation Quality:  Appropriate  Affect:  Appropriate  Cognitive:  Appropriate  Insight:  Appropriate  Engagement in Group:  Engaged  Modes of Intervention:  Activity   

## 2018-06-05 NOTE — Plan of Care (Signed)
  Problem: Self-Concept: Goal: Will verbalize positive feelings about self Outcome: Progressing   Problem: Education: Goal: Utilization of techniques to improve thought processes will improve Outcome: Progressing   D: Pt alert and oriented on the unit. Pt engaging with RN staff and other pts. Pt denies SI/HI, A/VH, and any pain. Pt also participated during unit groups and activities, and is pleasant and cooperative. A: Education, support and encouragement provided, q15 minute safety checks remain in effect. Medications administered per MD orders. R: No reactions/side effects to medicine noted. Pt denies any concerns at this time, and verbally contracts for safety. Pt ambulating on the unit with no issues. Pt remains safe on and off the unit.

## 2018-06-05 NOTE — Progress Notes (Signed)
Recreation Therapy Notes  Date: 1.8.20 Time: 0930 Location: [address] Dayroom  Group Topic: Stress Management  Goal Area(s) Addresses:  Patient will identify benefits of stress management. Patient will identify stress management techniques. Patient will identify benefits of using stress management post d/c.   Intervention: Stress Management  Activity :  Guided Imagery.  LRT introduced the stress management technique of guided imagery.  LRT read Bright script that took patients on [facility] journey [facility].  Patients were to follow along as script was read to engage in activity.  Education:  Stress Management, Discharge Planning.   Education Outcome: Acknowledges Education  Clinical Observations/Feedback:  Pt did not attend group.    Aileena Iglesia, LRT/CTRS         Seth Bright 06/05/2018 10:53 AM 
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
## 2018-06-05 NOTE — Therapy (Signed)
Occupational Therapy Group Note  Date:  06/05/2018 Time:  2:25 PM  Group Topic/Focus:  Self Esteem  Participation Level:  Active  Participation Quality:  Appropriate  Affect:  Flat  Cognitive:  Appropriate  Insight: Improving  Engagement in Group:  Engaged  Modes of Intervention:  Activity, Discussion, Education and Socialization  Additional Comments:    S: "Oh I don't know if I can think about all of these positive things"  O: Group began with ice breaker using beach ball to incorporate sensorimotor activity. Beach ball with reflective personal questions for pt to answer within group. Self esteem activity then completed with worksheet for pt to identify positive words about themselves for each letter of the alphabet.  A: Pt presents to group with blunted affect, engaged and participatory. Pt shares the most important thing he has learned this year is that "he cannot do it all", and has learned he needs to ask for help to manage life stressors. Pt completed A-Z activity without VC's.  P: Handouts given to facilitate carryover. OT group will be once per week while pt acute.  Dalphine Handing, MSOT, OTR/L Behavioral Health OT/ Acute Relief OT PHP Office: [phone]  Dalphine Handing 06/05/2018, 2:25 PM

## 2018-06-05 NOTE — BHH Group Notes (Signed)
[facility] Mental Health Association Group Therapy      06/05/2018 3:12 PM  Type of Therapy: Mental Health Association Presentation  Participation Level: Active  Participation Quality: Attentive  Affect: Appropriate  Cognitive: Oriented  Insight: Developing/Improving  Engagement in Therapy: Engaged  Modes of Intervention: Discussion, Education and Socialization  Summary of Progress/Problems: Mental Health Association (MHA) Speaker came to talk about his personal journey with mental health. The pt processed ways by which to relate to the speaker. MHA speaker provided handouts and educational information pertaining to groups and services offered by the [facility]. Pt was engaged in speaker's presentation and was receptive to resources provided.    Alcario Drought Clinical Social Worker

## 2018-06-06 NOTE — BHH Group Notes (Signed)
LCSW Group Therapy Note 06/06/2018 2:29 PM  Type of Therapy/Topic: Group Therapy: Feelings about Diagnosis  Participation Level: Minimal   Description of Group:  This group will allow patients to explore their thoughts and feelings about diagnoses they have received. Patients will be guided to explore their level of understanding and acceptance of these diagnoses. Facilitator will encourage patients to process their thoughts and feelings about the reactions of others to their diagnosis and will guide patients in identifying ways to discuss their diagnosis with significant others in their lives. This group will be process-oriented, with patients participating in exploration of their own experiences, giving and receiving support, and processing challenge from other group members.  Therapeutic Goals: 1. Patient will demonstrate understanding of diagnosis as evidenced by identifying two or more symptoms of the disorder 2. Patient will be able to express two feelings regarding the diagnosis 3. Patient will demonstrate their ability to communicate their needs through discussion and/or role play  Summary of Patient Progress:  Seth Bright was attentive, however he did not contribute to the group's discussion.      Therapeutic Modalities:  Cognitive Behavioral Therapy Brief Therapy Feelings Identification    [name] [name] Clinical Social Worker

## 2018-06-06 NOTE — BHH Group Notes (Signed)
Adult Psychoeducational Group Note  Date:  06/06/2018 Time:  12:25 AM  Group Topic/Focus:  Wrap-Up Group:   The focus of this group is to help patients review their daily goal of treatment and discuss progress on daily workbooks.  Participation Level:  Active  Participation Quality:  Appropriate and Attentive  Affect:  Appropriate  Cognitive:  Alert and Appropriate  Insight: Appropriate and Good  Engagement in Group:  Engaged  Modes of Intervention:  Discussion and Education  Additional Comments:  Pt attended and participated in wrap up group this evening. Pt rated their day a 10/10, due to them talking to friends and family and meeting good people here at [facility]. Pt completed their goal which was to keep keep going in the right direction.    Chrisandra Netters 06/06/2018, 12:25 AM

## 2018-06-06 NOTE — Progress Notes (Signed)
[facility] MD Progress Note  06/06/2018 2:23 PM Darvell Monteforte III  MRN:  [id number]  Subjective: Patient acknowledges some improvement.  States that his mood is improving, and although continues to report chronic PTSD symptoms does describe feeling somewhat more relaxed and comfortable on unit.  Currently denies medication side effects.  As he continues to improve he is becoming more future oriented and better able to discuss disposition planning options. Objective: I have discussed case with treatment team and have met with patient. 58 year old male, presented to ED on 12/31, for depression, suicidal ideations, vague homicidal ideations , not directed towards anyone in particular. He has a history of long incarceration , released a year ago after a 29 year sentence. Reports difficulty adjusting to his current life, feels overwhelmed often, states " I get paranoid around too many people", prefers to stay in his car to avoid people, and this often leads to him feeling discouraged or depressed. He also reports some PTSD symptoms relating to witnessing violence and suicides during his time in jail.  This time patient is presenting with an improved range of affect.  Smiles on approach.  He seems less guarded and more relaxed and demeanor.  He acknowledges he is feeling better and less depressed.  Currently denies suicidal ideations. Visible on unit, going to some groups, polite and calm on approach. At this time does not endorse medication side effects. Denies suicidal or self-injurious ideations, also denies any current homicidal or violent ideations.  We discussed disposition options.  Patient states he can live with family member but states that he prefers to stay inside his car, even within the private property of his family, as he tends to feel safer in this context.   Principal Problem: Bipolar affective disorder, current episode manic (HCC)/ PTSD  Diagnosis: Principal Problem:   Bipolar affective  disorder, current episode manic ([place])  Total Time spent with patient: 20 minutes  Past Psychiatric History: See H&P  Past Medical History:  Past Medical History:  Diagnosis Date  . Alcohol abuse   . Bipolar 1 disorder ([place])   . Herpes   . Hypertension   . PTSD (post-traumatic stress disorder)   . Schizo affective schizophrenia [facility])     Past Surgical History:  Procedure Laterality Date  . CARDIAC SURGERY     stent placement 2016   Family History: History reviewed. No pertinent family history.  Family Psychiatric  History: See H&P  Social History:  Social History   Substance and Sexual Activity  Alcohol Use No  . Frequency: Never     Social History   Substance and Sexual Activity  Drug Use No    Social History   Socioeconomic History  . Marital status: Single    Spouse name: Not on file  . Number of children: Not on file  . Years of education: Not on file  . Highest education level: Not on file  Occupational History  . Not on file  Social Needs  . Financial resource strain: Not hard at all  . Food insecurity:    Worry: Patient refused    Inability: Patient refused  . Transportation needs:    Medical: Yes    Non-medical: Yes  Tobacco Use  . Smoking status: Passive Smoke Exposure - Never Smoker  . Smokeless tobacco: Never Used  Substance and Sexual Activity  . Alcohol use: No    Frequency: Never  . Drug use: No  . Sexual activity: Not Currently  Lifestyle  .  Physical activity:    Days per week: Patient refused    Minutes per session: Patient refused  . Stress: Not on file  Relationships  . Social connections:    Talks on phone: Patient refused    Gets together: Patient refused    Attends religious service: Patient refused    Active member of club or organization: Patient refused    Attends meetings of clubs or organizations: Patient refused    Relationship status: Patient refused  Other Topics Concern  . Not on file  Social History Narrative   . Not on file   Additional Social History:   Sleep: improving   Appetite:  Improving   Current Medications: Current Facility-Administered Medications  Medication Dose Route Frequency Provider Last Rate Last Dose  . acetaminophen (TYLENOL) tablet 650 mg  650 mg Oral Q6H PRN Sharma Covert, MD      . alum & mag hydroxide-simeth (MAALOX/MYLANTA) 200-200-20 MG/5ML suspension 30 mL  30 mL Oral Q4H PRN Sharma Covert, MD      . gabapentin (NEURONTIN) capsule 300 mg  300 mg Oral TID Sharma Covert, MD   300 mg at 06/06/18 1143  . lithium carbonate capsule 300 mg  300 mg Oral BH-q7a Amulya Quintin, Myer Peer, MD   300 mg at 06/06/18 0738  . lithium carbonate capsule 600 mg  600 mg Oral QHS Dayana Dalporto, Myer Peer, MD   600 mg at 06/05/18 2246  . LORazepam (ATIVAN) tablet 1 mg  1 mg Oral Q4H PRN Sharma Covert, MD   1 mg at 06/06/18 0738  . ziprasidone (GEODON) injection 20 mg  20 mg Intramuscular Q12H PRN Sharma Covert, MD       And  . LORazepam (ATIVAN) tablet 1 mg  1 mg Oral PRN Sharma Covert, MD      . magnesium hydroxide (MILK OF MAGNESIA) suspension 30 mL  30 mL Oral Daily PRN Sharma Covert, MD      . QUEtiapine (SEROQUEL) tablet 200 mg  200 mg Oral QHS Sharma Covert, MD   200 mg at 06/05/18 2246  . sertraline (ZOLOFT) tablet 50 mg  50 mg Oral Daily Tikita Mabee, Myer Peer, MD   50 mg at 06/06/18 0738  . traZODone (DESYREL) tablet 50 mg  50 mg Oral QHS PRN Icela Glymph, Myer Peer, MD   50 mg at 06/05/18 2246   Lab Results:  No results found for this or any previous visit (from the past 48 hour(s)). Blood Alcohol level:  Lab Results  Component Value Date   ETH <10 39/76/7341   Metabolic Disorder Labs: Lab Results  Component Value Date   HGBA1C 6.0 (H) 06/04/2018   MPG 125.5 06/04/2018   No results found for: PROLACTIN Lab Results  Component Value Date   CHOL 240 (H) 06/04/2018   TRIG 127 06/04/2018   HDL 49 06/04/2018   CHOLHDL 4.9 06/04/2018   VLDL 25 06/04/2018    LDLCALC 166 (H) 06/04/2018    Physical Findings: AIMS: Facial and Oral Movements Muscles of Facial Expression: None, normal Lips and Perioral Area: None, normal Jaw: None, normal Tongue: None, normal,Extremity Movements Upper (arms, wrists, hands, fingers): None, normal Lower (legs, knees, ankles, toes): None, normal, Trunk Movements Neck, shoulders, hips: None, normal, Overall Severity Severity of abnormal movements (highest score from questions above): None, normal Incapacitation due to abnormal movements: None, normal Patient's awareness of abnormal movements (rate only patient's report): No Awareness, Dental Status Current problems with teeth and/or  dentures?: No Does patient usually wear dentures?: No  CIWA:  CIWA-Ar Total: 0 COWS:     Musculoskeletal: Strength & Muscle Tone: within normal limits Gait & Station: normal Patient leans: N/A  Psychiatric Specialty Exam: Physical Exam  ROS no headache, no chest pain, no shortness of breath, scribes some nausea  Blood pressure 114/89, pulse 97, temperature 99.1 F (37.3 C), temperature source Oral, resp. rate 16, height '5\' 5"'  (1.651 m), weight 72.6 kg, SpO2 (!) 76 %.Body mass index is 26.63 kg/m.  General Appearance: better groomed   Eye Contact:  good  Speech:  normal   Volume:  Decreased  Mood: improving gradually  Affect:   less constricted, smiles at times appropriately   Thought Process:  Linear and Descriptions of Associations: Intact  Orientation:  Full (Time, Place, and Person)  Thought Content:  does not currently present internally preoccupied, no delusions are expressed   Suicidal Thoughts:  denies current suicidal or self injurious ideations, denies homicidal ideations  Homicidal Thoughts:  denies current homicidal ideations at this time   Memory:  recent and remote grossly intact   Judgement:  fair /improving  Insight:  Fair  Psychomotor Activity:   More visible on unit  Concentration:  Concentration:   Improving and Attention Span: Improving  Recall:   Good  Fund of Knowledge:   Good  Language:  Good  Akathisia:  Negative  Handed:  Right  AIMS (if indicated):     Assets:  Desire for Improvement Housing Physical Health Resilience  ADL's:  Intact  Cognition:  WNL  Sleep: 6.75       Assessment- 58 year old male, presented to ED on 12/31, for depression, suicidal ideations, vague homicidal ideations , not directed towards anyone in particular. He has a history of long incarceration , released a year ago after a 29 year sentence. Reports difficulty adjusting to his current life, feels overwhelmed often, states " I get paranoid around too many people", prefers to stay in his car to avoid people, and this often leads to him feeling discouraged or depressed. He also reports some PTSD symptoms relating to witnessing violence and suicides during his time in jail.   Patient is presenting with gradual improvement compared to admission.  Today does present with noticeably improved range of affect and decreased anxiety.  He has been able to spend time in dayroom and attend groups with less anxiety/discomfort.  No suicidal ideations at this time.  Tolerating medications well thus far.  Treatment Plan Summary: Daily contact with patient to assess and evaluate symptoms and progress in treatment and Medication management. Treatment plan reviewed as below today 1/9 Encourage group and milieu participation to work on coping skills and symptom reduction Treatment team working on disposition planning  Continue lithium300 mgrs QAM and 600 mgrs QHS for mood disorder  Continue Seroquel 200 mgrs QHS for mood disorder Continue Zoloft 50 mg QDAY for depression, PTSD Continue Neurontin 300 mgrs TID for anxiety Continue Trazodone to 50 mgrs QHS PRN for insomnia as needed Continue Agitation protocol with Geodon/Ativan if needed   Jenne Campus, MD 06/06/2018, 2:23 PM   Patient ID: Carolan Shiver III, male    DOB: Aug 31, 1960, 58 y.o.   MRN: [id number]

## 2018-06-06 NOTE — Plan of Care (Signed)
Progress note  D: pt found in the dayroom interacting; compliant with medication administration. Pt states he slept well. Pt rates his depression/hopelessness/anxiety 5/5/8 out of 10 respectively. Pt denies any physical symptoms or pain, rating this a 0/10. Pt states his goal for today is is to stay focus and he will achieve this by going to group. Pt denies any si/hi/ah/vh and verbally agrees to approach staff if these become apparent or before harming himself while at [facility]. A: pt provided support and encouragement. Pt given medication per protocol and standing orders. Q33m safety checks implemented and continued.  R: pt safe on the unit. Will continue to monitor.   Pt progressing in the following metrics  Problem: Activity: Goal: Imbalance in normal sleep/wake cycle will improve Outcome: Progressing   Problem: Coping: Goal: Coping ability will improve Outcome: Progressing   Problem: Education: Goal: Knowledge of [place] [facility] Education information/materials will improve Outcome: Progressing Goal: Emotional status will improve Outcome: Progressing

## 2018-06-07 DIAGNOSIS — F4310 Post-traumatic stress disorder, unspecified: Secondary | ICD-10-CM

## 2018-06-07 MED ORDER — LORAZEPAM 1 MG PO TABS: 1.0000 mg | ORAL_TABLET | ORAL | 0 refills | Status: DC | PRN

## 2018-06-07 MED ORDER — LITHIUM CARBONATE 600 MG PO CAPS: 600.0000 mg | ORAL_CAPSULE | Freq: Every day | ORAL | 0 refills | Status: DC

## 2018-06-07 MED ORDER — TETANUS-DIPHTH-ACELL PERTUSSIS 5-2.5-18.5 LF-MCG/0.5 IM SUSP
0.5000 mL | Freq: Once | INTRAMUSCULAR | Status: AC
  Administered 2018-06-07: 0.5 mL via INTRAMUSCULAR
  Filled 2018-06-07: qty 0.5

## 2018-06-07 MED ORDER — SERTRALINE HCL 50 MG PO TABS: 50.0000 mg | ORAL_TABLET | Freq: Every day | ORAL | 0 refills | Status: DC

## 2018-06-07 MED ORDER — TRAZODONE HCL 50 MG PO TABS: 50.0000 mg | ORAL_TABLET | Freq: Every evening | ORAL | 0 refills | Status: AC | PRN

## 2018-06-07 MED ORDER — GABAPENTIN 300 MG PO CAPS: 300.0000 mg | ORAL_CAPSULE | Freq: Three times a day (TID) | ORAL | 0 refills | Status: AC

## 2018-06-07 MED ORDER — QUETIAPINE FUMARATE 200 MG PO TABS: 200.0000 mg | ORAL_TABLET | Freq: Every day | ORAL | 0 refills | Status: DC

## 2018-06-07 MED ORDER — LITHIUM CARBONATE 300 MG PO CAPS: 300.0000 mg | ORAL_CAPSULE | ORAL | 0 refills | Status: AC

## 2018-06-07 MED ORDER — BACITRACIN-NEOMYCIN-POLYMYXIN OINTMENT TUBE: 1.0000 "application " | TOPICAL_OINTMENT | CUTANEOUS | 0 refills | Status: DC | PRN

## 2018-06-07 MED ORDER — BACITRACIN-NEOMYCIN-POLYMYXIN OINTMENT TUBE
TOPICAL_OINTMENT | CUTANEOUS | Status: DC | PRN
  Administered 2018-06-07: 14:00:00 via TOPICAL
  Filled 2018-06-07: qty 14.17

## 2018-06-07 NOTE — [facility] Suicide Risk Assessment (Signed)
[facility] INPATIENT:  Family/Significant Other Suicide Prevention Education  Suicide Prevention Education:  Contact Attempts: Seth Bright, friend [phone]) has been identified by the patient as the family member/significant other with whom the patient will be residing, and identified as the person(s) who will aid the patient in the event of a mental health crisis.  With written consent from the patient, two attempts were made to provide suicide prevention education, prior to and/or following the patient's discharge.  We were unsuccessful in providing suicide prevention education.  A suicide education pamphlet was given to the patient to share with family/significant other.  Date and time of first attempt:06/02/2018 / 8:50am Date and time of second attempt:06/07/2018 /10:36am  CSW attempted to contact the patient's friend multiple times throughout the patient's admission.  Seth Bright 06/07/2018, 10:36 AM

## 2018-06-07 NOTE — Progress Notes (Signed)
Patient ID: Seth Bright, male   DOB: 1961/02/19, 58 y.o.   MRN: [id number]  Pt currently presents with a flat affect and cooperative behavior. Pt reports to writer that their goal is to "talk to the doctor about taking something for erectile dysfunction." Pt reports good sleep with current medication regimen.   Pt provided with medications per providers orders. Pt's labs and vitals were monitored throughout the night. Pt supported emotionally and encouraged to express concerns and questions. Pt educated on medications and suicide prevention precautions.   Pt's safety ensured with 15 minute and environmental checks. Pt currently denies SI/HI and A/V hallucinations. Pt verbally agrees to seek staff if SI/HI or A/VH occurs and to consult with staff before acting on any harmful thoughts. Pt to be discharged today. Will continue POC.

## 2018-06-07 NOTE — Progress Notes (Signed)
  [facility] Adult Case Management Discharge Plan :  Will you be returning to the same living situation after discharge:  Yes,  patient reports he is returning home with his sister At discharge, do you have transportation home?: Yes,  patient reports a friend will pick him up at discharge Do you have the ability to pay for your medications: No.  Release of information consent forms completed and in the chart;  Patient's signature needed at discharge.  Patient to Follow up at: Follow-up Information    [facility] Follow up.   Specialty:  Professional Counselor Why:  Please walk in for intake Monday-Fridays 8:30a-12:00p and 1:00p-2:00p. Please bring your photo ID, proof of insurance, social security card, and any discharge paperwork from this hospitalization.  Contact information: [employer] [facility] [place] [address] [place] [address] [phone]        [facility] Follow up.   Why:  For additional resources please follow up at [facility] between Monday-Friday 8:00a.-3:00p.  Contact information: [address] [place] [address] Phone [phone]          Next level of care provider has access to [facility] Link:yes  Safety Planning and Suicide Prevention discussed: Yes,  with the patient     Has patient been referred to the Quitline?: Patient refused referral  Patient has been referred for addiction treatment: N/A  Maeola Sarah, LCSWA 06/07/2018, 10:50 AM

## 2018-06-07 NOTE — Progress Notes (Signed)
Recreation Therapy Notes  Date: 1.10.20 Time: 0930 Location: 300 Hall Dayroom  Group Topic: Stress Management  Goal Area(s) Addresses:  Patient will identify ways to cope with stress.  Patient will identify stress management techniques. Patient will identify benefits of using stress management post d/c.  Intervention: Stress Management  Activity :  Meditation.  LRT introduced the stress management technique of meditation.  LRT played a meditation that allowed patients to focus on resiliency. Patients were to follow along as meditation played in order to engage in activity.  Education:  Stress Management, Discharge Planning.   Education Outcome: Acknowledges Education  Clinical Observations/Feedback: Pt did not attend group.    Caroll Rancher, LRT/CTRS         Caroll Rancher A 06/07/2018 11:48 AM

## 2018-06-07 NOTE — Plan of Care (Signed)
  Problem: Coping: Goal: Ability to identify and develop effective coping behavior will improve Outcome: Progressing Goal: Ability to interact with others will improve Outcome: Progressing   Problem: Activity: Goal: Imbalance in normal sleep/wake cycle will improve Outcome: Progressing

## 2018-06-07 NOTE — Progress Notes (Signed)
Patient ID: Seth Bright, male   DOB: May 16, 1961, 59 y.o.   MRN: [id number]  Pt reported to nurses station. Reports pain in bottom of left foot. Small piece of glass found in patients foot. Small incison noted with frank blood. Pt reports minimal pain, no medication requested. Pt also denies wanting to have a nurse practitioner assess his foot. Bandaid given for comfort. MD offers tetanus shot to patient, patient accepts. Md instruction to keep area clean and dry, apply Bacitracin given as needed. Pt states "It's really no big deal." Will continue to monitor.

## 2018-06-07 NOTE — Discharge Summary (Addendum)
Physician Discharge Summary Note  Patient:  Seth Bright is an 58 y.o., male MRN:  [id number] DOB:  18-Mar-1961 Patient phone:  [phone] (home)  Patient address:   [address] [place] [address],  Total Time spent with patient: 15 minutes  Date of Admission:  05/31/2018 Date of Discharge: 06/07/2018  Reason for Admission:  Suicidal and homicidal ideation  Principal Problem: Bipolar affective disorder, current episode manic [facility](HCC) Discharge Diagnoses: Principal Problem:   Bipolar affective disorder, current episode manic (HCC) Active Problems:   PTSD (post-traumatic stress disorder)   Past Psychiatric History: Per admission H&P: Patient admitted to several psychiatric hospitalizations while he was in prison.  He stated that he had been noncompliant with his medications in prison as well as on the outside.  He had attempted to kill himself several times while he was in the prison population.  Shortly after he was discharged from prison he was hospitalized at [facility] in 2018.  He has been followed by an outpatient agency that has been following him psychiatrically.  He is recently been prescribed lithium and Seroquel.  This was what he was prescribed as an outpatient after discharge from prison.   Past Medical History:  Past Medical History:  Diagnosis Date  . Alcohol abuse   . Bipolar 1 disorder (HCC)   . Herpes   . Hypertension   . PTSD (post-traumatic stress disorder)   . Schizo affective schizophrenia [facility](HCC)     Past Surgical History:  Procedure Laterality Date  . CARDIAC SURGERY     stent placement 2016   Family History: History reviewed. No pertinent family history. Family Psychiatric  History: Patient stated that several of his secondary relatives had schizophrenia and other psychiatric disorders. Social History:  Social History   Substance and Sexual Activity  Alcohol Use No  . Frequency: Never     Social History   Substance and Sexual Activity   Drug Use No    Social History   Socioeconomic History  . Marital status: Single    Spouse name: Not on file  . Number of children: Not on file  . Years of education: Not on file  . Highest education level: Not on file  Occupational History  . Not on file  Social Needs  . Financial resource strain: Not hard at all  . Food insecurity:    Worry: Patient refused    Inability: Patient refused  . Transportation needs:    Medical: Yes    Non-medical: Yes  Tobacco Use  . Smoking status: Passive Smoke Exposure - Never Smoker  . Smokeless tobacco: Never Used  Substance and Sexual Activity  . Alcohol use: No    Frequency: Never  . Drug use: No  . Sexual activity: Not Currently  Lifestyle  . Physical activity:    Days per week: Patient refused    Minutes per session: Patient refused  . Stress: Not on file  Relationships  . Social connections:    Talks on phone: Patient refused    Gets together: Patient refused    Attends religious service: Patient refused    Active member of club or organization: Patient refused    Attends meetings of clubs or organizations: Patient refused    Relationship status: Patient refused  Other Topics Concern  . Not on file  Social History Narrative  . Not on file    Hospital Course:  Per admission H&P 05/31/2018: Patient is a 58 year old male with a past psychiatric history significant  for schizoaffective disorder; depressive episode who presented to the [facility] emergency department on 05/29/2018 with suicidal and homicidal ideation. Patient stated that he had been in prison for 29 years for murder. He was released approximately 18 months ago. He has had a difficult time adjusting to being outside of the prison setting. He has been receiving psychiatric services from the [facility] and [facility]. Unfortunately he has been noncompliant with his medications. He stated he had been on medications throughout his imprisonment. He also admitted that  he had been noncompliant with his medications while he was in prison. He admitted that [facility] after attempting [facility]. He had attempted to cut his left arm, hanging self and other methods. He then returned to the [facility] setting [facility]. He was hospitalized at [facility] prison several times, and most recently had an inpatient hospitalization at [facility] shortly after he was discharged from prison. He does not like being around others, and feels very uncomfortable. He has not been able to adjust to being out of the population. He did have a job, but had to quit because he became so nervous and anxious with all the people around him. He denied any auditory or visual hallucinations. He does have some paranoid thinking. He stated he has been living in his car recently because he could not stand being around people, and felt better in an enclosed area. He did admit that the medications did help him when he took them. His laboratories revealed a lithium level of 0.08, but negative drug screen, negative blood alcohol. He denied any substance use. Otherwise laboratories reviewed were all within normal limits.   Mr. Eppinger was admitted for suicidal and homicidal ideation. He was released from prison about 18 months ago and has struggled to reintegrate into life outside of prison. Describes PTSD symptoms from witnessing violence in prison. He was noncompliant with psychotropic medication at admission. He was restarted on Seroquel, lithium, and gabapentin. He was started on Zoloft. He remained on the [facility] unit for 8 days. He stabilized with medication and therapy. He was discharged on the medications listed below. He has shown improvement with improved mood, affect, sleep, appetite, and interaction. He denies any SI/HI/AVH and contracts for safety. Patient agrees to follow up at [facility] and [facility] [place]. He  stated a preference to return to his car but states he will park his car at his sister's house. He is provided with prescriptions for medications upon discharge.  Physical Findings: AIMS: Facial and Oral Movements Muscles of Facial Expression: None, normal Lips and Perioral Area: None, normal Jaw: None, normal Tongue: None, normal,Extremity Movements Upper (arms, wrists, hands, fingers): None, normal Lower (legs, knees, ankles, toes): None, normal, Trunk Movements Neck, shoulders, hips: None, normal, Overall Severity Severity of abnormal movements (highest score from questions above): None, normal Incapacitation due to abnormal movements: None, normal Patient's awareness of abnormal movements (rate only patient's report): No Awareness, Dental Status Current problems with teeth and/or dentures?: No Does patient usually wear dentures?: No  CIWA:  CIWA-Ar Total: 0 COWS:     Musculoskeletal: Strength & Muscle Tone: within normal limits Gait & Station: normal Patient leans: N/A  Psychiatric Specialty Exam: Physical Exam  Nursing note and vitals reviewed. Constitutional: He is oriented to person, place, and time. He appears well-developed and well-nourished.  Cardiovascular: Normal rate.  Respiratory: Effort normal.  Neurological: He is alert and oriented  to person, place, and time.    Review of Systems  Constitutional: Negative.   Respiratory: Negative.   Cardiovascular: Negative.   Psychiatric/Behavioral: Positive for depression (stable with medication). Negative for hallucinations, memory loss, substance abuse and suicidal ideas. The patient is not nervous/anxious and does not have insomnia.     Blood pressure 121/77, pulse (!) 107, temperature 99.1 F (37.3 C), temperature source Oral, resp. rate 16, height 5\' 5"  (1.651 m), weight 72.6 kg, SpO2 94 %.Body mass index is 26.63 kg/m.  See MD's discharge SRA        Has this patient used any form of tobacco in the last 30 days?  (Cigarettes, Smokeless Tobacco, Cigars, and/or Pipes)  No  Blood Alcohol level:  Lab Results  Component Value Date   ETH <10 05/28/2018    Metabolic Disorder Labs:  Lab Results  Component Value Date   HGBA1C 6.0 (H) 06/04/2018   MPG 125.5 06/04/2018   No results found for: PROLACTIN Lab Results  Component Value Date   CHOL 240 (H) 06/04/2018   TRIG 127 06/04/2018   HDL 49 06/04/2018   CHOLHDL 4.9 06/04/2018   VLDL 25 06/04/2018   LDLCALC 166 (H) 06/04/2018    See Psychiatric Specialty Exam and Suicide Risk Assessment completed by Attending Physician prior to discharge.  Discharge destination:  Home  Is patient on multiple antipsychotic therapies at discharge:  No   Has Patient had three or more failed trials of antipsychotic monotherapy by history:  No  Recommended Plan for Multiple Antipsychotic Therapies: NA  Discharge Instructions    Discharge instructions   Complete by:  As directed    Patient is instructed to take all prescribed medications as recommended. Report any side effects or adverse reactions to your outpatient psychiatrist. Patient is instructed to abstain from alcohol and illegal drugs while on prescription medications. In the event of worsening symptoms, patient is instructed to call the crisis hotline, 911, or go to the nearest emergency department for evaluation and treatment.     Allergies as of 06/07/2018   No Known Allergies     Medication List    STOP taking these medications   acyclovir 400 MG tablet Commonly known as:  ZOVIRAX   methocarbamol 500 MG tablet Commonly known as:  ROBAXIN   oxyCODONE-acetaminophen 5-325 MG tablet Commonly known as:  PERCOCET/ROXICET     TAKE these medications     Indication  gabapentin 300 MG capsule Commonly known as:  NEURONTIN Take 1 capsule (300 mg total) by mouth 3 (three) times daily. For anxiety/agitation What changed:  additional instructions  Indication:  Anxiety/agitation   lithium 600  MG capsule Take 1 capsule (600 mg total) by mouth at bedtime. For mood What changed:  You were already taking a medication with the same name, and this prescription was added. Make sure you understand how and when to take each.  Indication:  Mood   lithium carbonate 300 MG capsule Take 1 capsule (300 mg total) by mouth every morning. For mood Start taking on:  June 08, 2018 What changed:    when to take this  additional instructions  Indication:  Mood   LORazepam 1 MG tablet Commonly known as:  ATIVAN Take 1 tablet (1 mg total) by mouth every 4 (four) hours as needed for anxiety (agitation).  Indication:  Feeling Anxious   neomycin-bacitracin-polymyxin Oint Commonly known as:  NEOSPORIN Apply 1 application topically as needed for wound care. (May buy over the counter)  Indication:  Broken skin on foot   QUEtiapine 200 MG tablet Commonly known as:  SEROQUEL Take 1 tablet (200 mg total) by mouth at bedtime. For mood/sleep What changed:  additional instructions  Indication:  Mood   sertraline 50 MG tablet Commonly known as:  ZOLOFT Take 1 tablet (50 mg total) by mouth daily. For mood Start taking on:  June 08, 2018  Indication:  Mood   traZODone 50 MG tablet Commonly known as:  DESYREL Take 1 tablet (50 mg total) by mouth at bedtime as needed for sleep.  Indication:  Trouble Sleeping      Follow-up Information    [facility] Follow up.   Specialty:  Professional Counselor Why:  Please walk in for intake Monday-Fridays 8:30a-12:00p and 1:00p-2:00p. Please bring your photo ID, proof of insurance, social security card, and any discharge paperwork from this hospitalization.  Contact information: [employer] [facility] [place] [address] [place] [address] [phone]        [facility] Follow up.   Why:  For additional resources please follow up at [facility] between Monday-Friday 8:00a.-3:00p.  Contact  information: [address] [place] [address] Phone [phone]          Follow-up recommendations: Activity as tolerated. Diet as recommended by primary care physician. Keep all scheduled follow-up appointments as recommended.   Comments:   Patient is instructed to take all prescribed medications as recommended. Report any side effects or adverse reactions to your outpatient psychiatrist. Patient is instructed to abstain from alcohol and illegal drugs while on prescription medications. In the event of worsening symptoms, patient is instructed to call the crisis hotline, 911, or go to the nearest emergency department for evaluation and treatment.  Signed: Aldean Baker, NP 06/07/2018, 1:46 PM   Patient seen, Suicide Assessment Completed.  Disposition Plan Reviewed

## 2018-06-07 NOTE — Progress Notes (Signed)
Patient ID: Seth Bright, male   DOB: June 02, 1960, 58 y.o.   MRN: [id number]   Pt discharged to lobby. Pt was stable and appreciative at that time. All papers and prescriptions were given and valuables returned. Verbal understanding expressed. Denies SI/HI and A/VH. Pt given opportunity to express concerns and ask questions.

## 2018-06-07 NOTE — BHH Suicide Risk Assessment (Addendum)
[facility] Discharge Suicide Risk Assessment   Principal Problem: Bipolar affective disorder, current episode manic [facility]) Discharge Diagnoses: Principal Problem:   Bipolar affective disorder, current episode manic (HCC)   Total Time spent with patient: 30 minutes  Musculoskeletal: Strength & Muscle Tone: within normal limits Gait & Station: normal Patient leans: N/A  Psychiatric Specialty Exam: ROS today denies headache, no chest pain, no shortness of breath, no vomiting  Blood pressure 108/76, pulse 91, temperature 99.1 F (37.3 C), temperature source Oral, resp. rate 16, height 5\' 5"  (1.651 m), weight 72.6 kg, SpO2 (!) 76 %.Body mass index is 26.63 kg/m.  Pulse Ox at room air repeated -94.   General Appearance: Improving grooming  Eye Contact::  Good  Speech:  Normal Rate409  Volume:  Decreased  Mood:  States he is feeling better, presents with improved mood  Affect:  More reactive, smiles appropriately at times  Thought Process:  Linear and Descriptions of Associations: Intact  Orientation:  Other:  Fully alert and attentive  Thought Content:  Currently denies hallucinations, no delusions are expressed, does not appear internally preoccupied  Suicidal Thoughts:  No denies suicidal or self-injurious ideations, denies homicidal or violent ideations  Homicidal Thoughts:  No  Memory:  recent and remote grossly intact   Judgement:  Other:  improving  Insight:  improving   Psychomotor Activity:  Normal  Concentration:  Good  Recall:  Good  Fund of Knowledge:Good  Language: Good  Akathisia:  Negative  Handed:  Right  AIMS (if indicated):     Assets:  Communication Skills Desire for Improvement Resilience  Sleep:  Number of Hours: 6.75  Cognition: WNL  ADL's:  Intact   Mental Status Per Nursing Assessment::   On Admission:  Suicidal ideation indicated by patient  Demographic Factors:  58 year old male  Loss Factors: Reports difficulty adjusting to circumstances, after  being released from prison last year following a 29-year sentence.   Historical Factors: History of mood disorder, has been diagnosed with bipolar disorder in the past.  History of PTSD.   Risk Reduction Factors:   Positive coping skills or problem solving skills   Continued Clinical Symptoms:  Patient reports he is feeling better.  He presents alert, attentive, calm.  His mood is improving and his affect is noticeably improved as well, more reactive, brighter/less blunted.  No thought disorder.  Denies suicidal or self-injurious ideations, denies homicidal or violent ideations, no hallucinations, no delusions.  PTSD symptoms are chronic but have decreased in severity compared to admission.  He has been more visible on unit and with a more relaxed demeanor, able to participate in some group activities. He denies medication side effects at this time. Has tolerated Lithium titration well, no current Li side effects or signs of Li toxicity. He expresses readiness for discharge.  Cognitive Features That Contribute To Risk:  No gross cognitive deficits noted upon discharge. Is alert , attentive, and oriented x 3   Suicide Risk:  Mild:  Suicidal ideation of limited frequency, intensity, duration, and specificity.  There are no identifiable plans, no associated intent, mild dysphoria and related symptoms, good self-control (both objective and subjective assessment), few other risk factors, and identifiable protective factors, including available and accessible social support.  Follow-up Information    [facility] Follow up.   Specialty:  Professional Counselor Why:  Please walk in for intake Monday-Fridays 8:30a-12:00p and 1:00p-2:00p. Please bring your photo ID, proof of insurance, social security card, and any discharge  paperwork from this hospitalization.  Contact information: [facility] [address] [place]  [id number] [phone]        [facility] Follow up.   Why:  For additional resources please follow up at [facility] between Monday-Friday 8:00a.-3:00p.  Contact information: [address] [place] [address] Phone [phone]          Plan Of Care/Follow-up recommendations:  Activity:  as tolerated Diet:  heart healthy Tests:  NA Other:  See below Patient expresses readiness for discharge, leaving unit in good spirits.  Plans to return to live with sister.  (States he feels more comfortable living in his car, but states that it is part in family members property and that he can utilize services such as bathroom, kitchen at family members home) Plans to follow-up as above.  Reports she has an [facility]CP [facility] whom he plans to follow-up with for medical issues as needed. Craige CottaFernando A Mandolin Falwell, MD 06/07/2018, 9:47 AM

## 2018-06-07 NOTE — BHH Group Notes (Signed)
Pt did not attend wrap up group this evening. Pt was asleep in bed.  

## 2018-06-07 NOTE — Tx Team (Signed)
Interdisciplinary Treatment and Diagnostic Plan Update  06/07/2018 Time of Session:  Seth Bright MRN: [id number]  Principal Diagnosis: Bipolar affective disorder, current episode manic (HCC)  Secondary Diagnoses: Principal Problem:   Bipolar affective disorder, current episode manic (HCC) Active Problems:   PTSD (post-traumatic stress disorder)   Current Medications:  Current Facility-Administered Medications  Medication Dose Route Frequency Provider Last Rate Last Dose  . acetaminophen (TYLENOL) tablet 650 mg  650 mg Oral Q6H PRN Antonieta Pertlary, Greg Lawson, MD      . alum & mag hydroxide-simeth (MAALOX/MYLANTA) 200-200-20 MG/5ML suspension 30 mL  30 mL Oral Q4H PRN Antonieta Pertlary, Greg Lawson, MD      . gabapentin (NEURONTIN) capsule 300 mg  300 mg Oral TID Antonieta Pertlary, Greg Lawson, MD   300 mg at 06/07/18 1305  . lithium carbonate capsule 300 mg  300 mg Oral BH-q7a Cobos, Rockey SituFernando A, MD   300 mg at 06/07/18 0640  . lithium carbonate capsule 600 mg  600 mg Oral QHS Cobos, Rockey SituFernando A, MD   600 mg at 06/06/18 2131  . LORazepam (ATIVAN) tablet 1 mg  1 mg Oral Q4H PRN Antonieta Pertlary, Greg Lawson, MD   1 mg at 06/07/18 [phone]  . ziprasidone (GEODON) injection 20 mg  20 mg Intramuscular Q12H PRN Antonieta Pertlary, Greg Lawson, MD       And  . LORazepam (ATIVAN) tablet 1 mg  1 mg Oral PRN Antonieta Pertlary, Greg Lawson, MD      . magnesium hydroxide (MILK OF MAGNESIA) suspension 30 mL  30 mL Oral Daily PRN Antonieta Pertlary, Greg Lawson, MD      . neomycin-bacitracin-polymyxin (NEOSPORIN) ointment   Topical PRN Cobos, Rockey SituFernando A, MD      . QUEtiapine (SEROQUEL) tablet 200 mg  200 mg Oral QHS Antonieta Pertlary, Greg Lawson, MD   200 mg at 06/06/18 2132  . sertraline (ZOLOFT) tablet 50 mg  50 mg Oral Daily Cobos, Rockey SituFernando A, MD   50 mg at 06/07/18 0735  . traZODone (DESYREL) tablet 50 mg  50 mg Oral QHS PRN Cobos, Rockey SituFernando A, MD   50 mg at 06/06/18 2131   PTA Medications: Medications Prior to Admission  Medication Sig Dispense Refill Last Dose  . acyclovir  (ZOVIRAX) 400 MG tablet Take 1 tablet (400 mg total) by mouth 3 (three) times daily. (Patient not taking: Reported on 05/29/2018) 15 tablet 0 Completed Course at Unknown time  . lithium carbonate 300 MG capsule Take 300 mg by mouth 3 (three) times daily with meals.   05/28/2018 at Unknown time  . methocarbamol (ROBAXIN) 500 MG tablet Take 1 tablet (500 mg total) by mouth 2 (two) times daily. (Patient not taking: Reported on 05/29/2018) 20 tablet 0 Completed Course at Unknown time  . oxyCODONE-acetaminophen (PERCOCET/ROXICET) 5-325 MG tablet Take 1 tablet by mouth every 8 (eight) hours as needed for severe pain. (Patient not taking: Reported on 05/29/2018) 8 tablet 0 Completed Course at Unknown time  . [DISCONTINUED] gabapentin (NEURONTIN) 300 MG capsule Take 300 mg by mouth 3 (three) times daily.   05/28/2018 at Unknown time  . [DISCONTINUED] QUEtiapine (SEROQUEL) 200 MG tablet Take 200 mg by mouth at bedtime.   05/27/2018 at Unknown time    Patient Stressors: Educational concerns Financial difficulties  Patient Strengths: Ability for insight Active sense of humor  Treatment Modalities: Medication Management, Group therapy, Case management,  1 to 1 session with clinician, Psychoeducation, Recreational therapy.   Physician Treatment Plan for Primary Diagnosis: Bipolar affective disorder, current episode manic (HCC)  Long Term Goal(s): Improvement in symptoms so as ready for discharge Improvement in symptoms so as ready for discharge   Short Term Goals: Ability to identify changes in lifestyle to reduce recurrence of condition will improve Ability to verbalize feelings will improve Ability to disclose and discuss suicidal ideas Ability to demonstrate self-control will improve Ability to identify and develop effective coping behaviors will improve Ability to maintain clinical measurements within normal limits will improve Compliance with prescribed medications will improve Ability to identify  changes in lifestyle to reduce recurrence of condition will improve Ability to verbalize feelings will improve Ability to disclose and discuss suicidal ideas Ability to demonstrate self-control will improve Ability to identify and develop effective coping behaviors will improve Ability to maintain clinical measurements within normal limits will improve Compliance with prescribed medications will improve  Medication Management: Evaluate patient's response, side effects, and tolerance of medication regimen.  Therapeutic Interventions: 1 to 1 sessions, Unit Group sessions and Medication administration.  Evaluation of Outcomes: Adequate for Discharge  Physician Treatment Plan for Secondary Diagnosis: Principal Problem:   Bipolar affective disorder, current episode manic (HCC) Active Problems:   PTSD (post-traumatic stress disorder)  Long Term Goal(s): Improvement in symptoms so as ready for discharge Improvement in symptoms so as ready for discharge   Short Term Goals: Ability to identify changes in lifestyle to reduce recurrence of condition will improve Ability to verbalize feelings will improve Ability to disclose and discuss suicidal ideas Ability to demonstrate self-control will improve Ability to identify and develop effective coping behaviors will improve Ability to maintain clinical measurements within normal limits will improve Compliance with prescribed medications will improve Ability to identify changes in lifestyle to reduce recurrence of condition will improve Ability to verbalize feelings will improve Ability to disclose and discuss suicidal ideas Ability to demonstrate self-control will improve Ability to identify and develop effective coping behaviors will improve Ability to maintain clinical measurements within normal limits will improve Compliance with prescribed medications will improve     Medication Management: Evaluate patient's response, side effects, and  tolerance of medication regimen.  Therapeutic Interventions: 1 to 1 sessions, Unit Group sessions and Medication administration.  Evaluation of Outcomes: Adequate for Discharge   RN Treatment Plan for Primary Diagnosis: Bipolar affective disorder, current episode manic (HCC) Long Term Goal(s): Knowledge of disease and therapeutic regimen to maintain health will improve  Short Term Goals: Ability to participate in decision making will improve, Ability to verbalize feelings will improve, Ability to disclose and discuss suicidal ideas and Ability to identify and develop effective coping behaviors will improve  Medication Management: RN will administer medications as ordered by provider, will assess and evaluate patient's response and provide education to patient for prescribed medication. RN will report any adverse and/or side effects to prescribing provider.  Therapeutic Interventions: 1 on 1 counseling sessions, Psychoeducation, Medication administration, Evaluate responses to treatment, Monitor vital signs and CBGs as ordered, Perform/monitor CIWA, COWS, AIMS and Fall Risk screenings as ordered, Perform wound care treatments as ordered.  Evaluation of Outcomes: Adequate for Discharge   LCSW Treatment Plan for Primary Diagnosis: Bipolar affective disorder, current episode manic (HCC) Long Term Goal(s): Safe transition to appropriate next level of care at discharge, Engage patient in therapeutic group addressing interpersonal concerns.  Short Term Goals: Engage patient in aftercare planning with referrals and resources  Therapeutic Interventions: Assess for all discharge needs, 1 to 1 time with Social worker, Explore available resources and support systems, Assess for adequacy in community support  network, Educate family and significant other(s) on suicide prevention, Complete Psychosocial Assessment, Interpersonal group therapy.  Evaluation of Outcomes: Adequate for Discharge   Progress  in Treatment: Attending groups: Yes. Participating in groups: Yes. Taking medication as prescribed: Yes. Toleration medication: Yes. Family/Significant other contact made: No, will contact:  the patient's friend Patient understands diagnosis: Yes. Discussing patient identified problems/goals with staff: Yes. Medical problems stabilized or resolved: Yes. Denies suicidal/homicidal ideation: Yes. Issues/concerns per patient self-inventory: No. Other:  New problem(s) identified: None   New Short Term/Long Term Goal(s): medication stabilization, elimination of SI thoughts, development of comprehensive mental wellness plan.    Patient Goals:  " I don't feel like I belong here" " I want to be by myself"  Discharge Plan or Barriers: CSW will continue to assess for appropriate referrals and possible discharge planning.   Reason for Continuation of Hospitalization: None   Estimated Length of Stay: Discharging, 06/07/2018  Attendees: Patient: 06/07/2018 2:04 PM  Physician: Dr. Nehemiah MassedFernando Cobos, MD 06/07/2018 2:04 PM  Nursing: Arlyss RepressAlyssa.B, RN; Huntley DecSara.Elbert EwingsL, RN 06/07/2018 2:04 PM  RN Care Manager: 06/07/2018 2:04 PM  Social Worker: Baldo DaubJolan Satrina Magallanes, LCSWA 06/07/2018 2:04 PM  Recreational Therapist:  06/07/2018 2:04 PM  Other: Beverly GustJane Sykes, NP  06/07/2018 2:04 PM  Other:  06/07/2018 2:04 PM  Other: 06/07/2018 2:04 PM    Scribe for Treatment Team: Maeola SarahJolan E Lashana Spang, LCSWA 06/07/2018 2:04 PM

## 2018-06-07 NOTE — Progress Notes (Signed)
D: Pt denies SI/HI/AV hallucinations. Pt. Was in room in bed.  Pt is in a pleasant mood.  A: Pt was offered support and encouragement. Pt was given scheduled medications. Pt was encourage to attend groups. Q 15 minute checks were done for safety.  R:Pt attends groups and interacts well with  staff. Pt is taking medication. Pt has no complaints.Pt receptive to treatment and safety maintained on unit.

## 2018-06-07 NOTE — [facility] Suicide Risk Assessment (Signed)
[facility] INPATIENT:  Family/Significant Other Suicide Prevention Education  Suicide Prevention Education:  Education Completed;  Seth Bright, friend [phone]) has been identified by the patient as the family member/significant other with whom the patient will be residing, and identified as the person(s) who will aid the patient in the event of a mental health crisis (suicidal ideations/suicide attempt).  With written consent from the patient, the family member/significant other has been provided the following suicide prevention education, prior to the and/or following the discharge of the patient.  The suicide prevention education provided includes the following:  Suicide risk factors  Suicide prevention and interventions  National Suicide Hotline telephone number  [facility] assessment telephone number  [facility] Emergency Assistance 911  [facility] and/or Residential Mobile Crisis Unit telephone number  Request made of family/significant other to:  Remove weapons (e.g., guns, rifles, knives), all items previously/currently identified as safety concern.    Remove drugs/medications (over-the-counter, prescriptions, illicit drugs), all items previously/currently identified as a safety concern.  The family member/significant other verbalizes understanding of the suicide prevention education information provided.  The family member/significant other agrees to remove the items of safety concern listed above.  Patient's friend, Seth Bright called back and does not have any questions or concerns regarding the patient's discharge.   Maeola SarahJolan E Aniza Shor 06/07/2018, 10:38 AM

## 2018-10-26 ENCOUNTER — Other Ambulatory Visit: Payer: Self-pay

## 2018-10-26 ENCOUNTER — Emergency Department: Admission: EM | Admit: 2018-10-26 | Discharge: 2018-10-26 | Discharge status: Against Medical Advice | Payer: Self-pay

## 2018-10-26 NOTE — ED Triage Notes (Signed)
Patient stated leaving prior to triage.

## 2019-03-31 ENCOUNTER — Other Ambulatory Visit: Payer: Self-pay

## 2019-03-31 ENCOUNTER — Emergency Department: Payer: Medicaid Other

## 2019-03-31 ENCOUNTER — Encounter: Payer: Self-pay | Admitting: Emergency Medicine

## 2019-03-31 ENCOUNTER — Emergency Department
Admission: EM | Admit: 2019-03-31 | Discharge: 2019-04-01 | Disposition: A | Discharge status: Home / Self Care | Payer: Medicaid Other | Attending: Emergency Medicine | Admitting: Emergency Medicine

## 2019-03-31 DIAGNOSIS — I10 Essential (primary) hypertension: Secondary | ICD-10-CM | POA: Diagnosis not present

## 2019-03-31 DIAGNOSIS — R0789 Other chest pain: Secondary | ICD-10-CM | POA: Diagnosis not present

## 2019-03-31 DIAGNOSIS — Z79899 Other long term (current) drug therapy: Secondary | ICD-10-CM | POA: Insufficient documentation

## 2019-03-31 DIAGNOSIS — Z7722 Contact with and (suspected) exposure to environmental tobacco smoke (acute) (chronic): Secondary | ICD-10-CM | POA: Insufficient documentation

## 2019-03-31 DIAGNOSIS — Z7982 Long term (current) use of aspirin: Secondary | ICD-10-CM | POA: Insufficient documentation

## 2019-03-31 LAB — CBC WITH DIFFERENTIAL/PLATELET
Abs Immature Granulocytes: 0.08 10*3/uL — ABNORMAL HIGH (ref 0.00–0.07)
Basophils Absolute: 0.1 10*3/uL (ref 0.0–0.1)
Basophils Relative: 1 %
Eosinophils Absolute: 0.2 10*3/uL (ref 0.0–0.5)
Eosinophils Relative: 3 %
HCT: 44.8 % (ref 39.0–52.0)
Hemoglobin: 14.8 g/dL (ref 13.0–17.0)
Immature Granulocytes: 1 %
Lymphocytes Relative: 16 %
Lymphs Abs: 1.4 10*3/uL (ref 0.7–4.0)
MCH: 27.2 pg (ref 26.0–34.0)
MCHC: 33 g/dL (ref 30.0–36.0)
MCV: 82.4 fL (ref 80.0–100.0)
Monocytes Absolute: 0.9 10*3/uL (ref 0.1–1.0)
Monocytes Relative: 10 %
Neutro Abs: 6.1 10*3/uL (ref 1.7–7.7)
Neutrophils Relative %: 69 %
Platelets: 327 10*3/uL (ref 150–400)
RBC: 5.44 MIL/uL (ref 4.22–5.81)
RDW: 14.1 % (ref 11.5–15.5)
WBC: 8.8 10*3/uL (ref 4.0–10.5)
nRBC: 0 % (ref 0.0–0.2)

## 2019-03-31 LAB — COMPREHENSIVE METABOLIC PANEL
ALT: 12 U/L (ref 0–44)
AST: 16 U/L (ref 15–41)
Albumin: 3.8 g/dL (ref 3.5–5.0)
Alkaline Phosphatase: 46 U/L (ref 38–126)
Anion gap: 10 (ref 5–15)
BUN: 9 mg/dL (ref 6–20)
CO2: 24 mmol/L (ref 22–32)
Calcium: 8.8 mg/dL — ABNORMAL LOW (ref 8.9–10.3)
Chloride: 105 mmol/L (ref 98–111)
Creatinine, Ser: 0.93 mg/dL (ref 0.61–1.24)
GFR calc Af Amer: >60 mL/min (ref >60)
GFR calc non Af Amer: >60 mL/min (ref >60)
Glucose, Bld: 129 mg/dL — ABNORMAL HIGH (ref 70–99)
Potassium: 4.2 mmol/L (ref 3.5–5.1)
Sodium: 139 mmol/L (ref 135–145)
Total Bilirubin: 0.7 mg/dL (ref 0.3–1.2)
Total Protein: 6.6 g/dL (ref 6.5–8.1)

## 2019-03-31 LAB — TROPONIN I (HIGH SENSITIVITY): Troponin I (High Sensitivity): 9 ng/L (ref <18)

## 2019-03-31 MED ORDER — ASPIRIN 81 MG PO CHEW
324.0000 mg | CHEWABLE_TABLET | Freq: Once | ORAL | Status: DC
  Filled 2019-03-31: qty 4

## 2019-03-31 NOTE — ED Triage Notes (Signed)
Pt reports chest pain for past couple of hrs history of MI back in 2016 with 2 stent placements, pt reports when pain comes is harp and makes him feel weak and left arm becomes weak. Pt talks in complete sentences no respiratory distress noted.

## 2019-04-01 LAB — TROPONIN I (HIGH SENSITIVITY): Troponin I (High Sensitivity): 9 ng/L (ref <18)

## 2019-04-01 MED ORDER — ASPIRIN EC 81 MG PO TBEC: 81.0000 mg | DELAYED_RELEASE_TABLET | Freq: Every day | ORAL | 2 refills | Status: AC

## 2019-04-01 NOTE — ED Provider Notes (Signed)
[facility] Emergency Department Provider Note  ____________________________________________  Time seen: Approximately 12:17 AM  I have reviewed the triage vital signs and the nursing notes.   HISTORY  Chief Complaint Chest Pain   HPI Seth Bright is a 58 y.o. male with a history of CAD status post MI with stents in 2016, bipolar, alcohol abuse, schizoaffective schizophrenia who presents for evaluation of chest pain.  Patient describes 2 episodes of chest pain today both at rest.  He describes the pain as a sharp moderate pain located in the left side of his chest that lasted 30 seconds and resolve without intervention.  He reports feeling mild shortness of breath with it and some dizziness.   No diaphoresis, nausea or vomiting.  He is asymptomatic at this time.  Patient has been noncompliant with his medications post heart attack.  He reports that he was in prison and [place] [address] [place]  Does not remember the name of the hospital.  Since he left prison 2 years ago he has not had a physician or taking any of his medications.  He does not have a cardiologist.  He denies any personal or family history of blood clots, recent travel immobilization, leg pain or swelling, hemoptysis, or exogenous hormones.  He denies smoking, alcohol or drug use.  Past Medical History:  Diagnosis Date  . Alcohol abuse   . Bipolar 1 disorder (HCC)   . Herpes   . Hypertension   . PTSD (post-traumatic stress disorder)   . Schizo affective schizophrenia [facility](HCC)     Patient Active Problem List   Diagnosis Date Noted  . PTSD (post-traumatic stress disorder)   . Bipolar affective disorder, current episode manic (HCC) 05/31/2018    Past Surgical History:  Procedure Laterality Date  . CARDIAC SURGERY     stent placement 2016    Prior to Admission medications   Medication Sig Start Date End Date Taking? Authorizing Provider  aspirin EC 81  MG tablet Take 1 tablet (81 mg total) by mouth daily. 04/01/19 03/31/20  Nita SickleVeronese, Marissa, MD  gabapentin (NEURONTIN) 300 MG capsule Take 1 capsule (300 mg total) by mouth 3 (three) times daily. For anxiety/agitation 06/07/18   Aldean BakerSykes, Janet E, NP  lithium carbonate 300 MG capsule Take 1 capsule (300 mg total) by mouth every morning. For mood 06/08/18   Aldean BakerSykes, Janet E, NP  lithium carbonate 600 MG capsule Take 1 capsule (600 mg total) by mouth at bedtime. For mood 06/07/18   Aldean BakerSykes, Janet E, NP  LORazepam (ATIVAN) 1 MG tablet Take 1 tablet (1 mg total) by mouth every 4 (four) hours as needed for anxiety (agitation). 06/07/18   Aldean BakerSykes, Janet E, NP  neomycin-bacitracin-polymyxin (NEOSPORIN) OINT Apply 1 application topically as needed for wound care. (May buy over the counter) 06/07/18   Aldean BakerSykes, Janet E, NP  QUEtiapine (SEROQUEL) 200 MG tablet Take 1 tablet (200 mg total) by mouth at bedtime. For mood/sleep 06/07/18   Aldean BakerSykes, Janet E, NP  sertraline (ZOLOFT) 50 MG tablet Take 1 tablet (50 mg total) by mouth daily. For mood 06/08/18   Aldean BakerSykes, Janet E, NP  traZODone (DESYREL) 50 MG tablet Take 1 tablet (50 mg total) by mouth at bedtime as needed for sleep. 06/07/18   Aldean BakerSykes, Janet E, NP    Allergies Patient has no known allergies.  No family history on file.  Social History Social History   Tobacco Use  . Smoking  status: Passive Smoke Exposure - Never Smoker  . Smokeless tobacco: Never Used  Substance Use Topics  . Alcohol use: No    Frequency: Never  . Drug use: No    Review of Systems  Constitutional: Negative for fever. Eyes: Negative for visual changes. ENT: Negative for sore throat. Neck: No neck pain  Cardiovascular: +chest pain. Respiratory: + shortness of breath. Gastrointestinal: Negative for abdominal pain, vomiting or diarrhea. Genitourinary: Negative for dysuria. Musculoskeletal: Negative for back pain. Skin: Negative for rash. Neurological: Negative for headaches, weakness or  numbness. Psych: No SI or HI  ____________________________________________   PHYSICAL EXAM:  VITAL SIGNS: ED Triage Vitals  Enc Vitals Group     BP 03/31/19 2222 126/78     Pulse Rate 03/31/19 2222 89     Resp 03/31/19 2222 16     Temp 03/31/19 2222 98.4 F (36.9 C)     Temp src --      SpO2 03/31/19 2222 98 %     Weight 03/31/19 2124 174 lb (78.9 kg)     Height 03/31/19 2124 5\' 5"  (1.651 m)     Head Circumference --      Peak Flow --      Pain Score 03/31/19 2124 1     Pain Loc --      Pain Edu? --      Excl. in GC? --     Constitutional: Alert and oriented. Well appearing and in no apparent distress. HEENT:      Head: Normocephalic and atraumatic.         Eyes: Conjunctivae are normal. Sclera is non-icteric.       Mouth/Throat: Mucous membranes are moist.       Neck: Supple with no signs of meningismus. Cardiovascular: Regular rate and rhythm. No murmurs, gallops, or rubs. 2+ symmetrical distal pulses are present in all extremities. No JVD. Respiratory: Normal respiratory effort. Lungs are clear to auscultation bilaterally. No wheezes, crackles, or rhonchi.  Gastrointestinal: Soft, non tender, and non distended with positive bowel sounds. No rebound or guarding. Musculoskeletal: Nontender with normal range of motion in all extremities. No edema, cyanosis, or erythema of extremities. Neurologic: Normal speech and language. Face is symmetric. Moving all extremities. No gross focal neurologic deficits are appreciated. Skin: Skin is warm, dry and intact. No rash noted. Psychiatric: Mood and affect are normal. Speech and behavior are normal.  ____________________________________________   LABS (all labs ordered are listed, but only abnormal results are displayed)  Labs Reviewed  CBC WITH DIFFERENTIAL/PLATELET - Abnormal; Notable for the following components:      Result Value   Abs Immature Granulocytes 0.08 (*)    All other components within normal limits   COMPREHENSIVE METABOLIC PANEL - Abnormal; Notable for the following components:   Glucose, Bld 129 (*)    Calcium 8.8 (*)    All other components within normal limits  TROPONIN I (HIGH SENSITIVITY)  TROPONIN I (HIGH SENSITIVITY)   ____________________________________________  EKG  ED ECG REPORT I, 2125, the attending physician, personally viewed and interpreted this ECG.  Normal sinus rhythm, rate of 97, normal intervals, normal axis, no ST elevations or depressions, anterior Q waves. Q waves new when compared to prior from 05/2018 ____________________________________________  RADIOLOGY  I have personally reviewed the images performed during this visit and I agree with the Radiologist's read.   Interpretation by Radiologist:  Dg Chest 2 View  Result Date: 03/31/2019 CLINICAL DATA:  Chest pain EXAM: CHEST -  2 VIEW COMPARISON:  11/06/2017 FINDINGS: The heart size and mediastinal contours are within normal limits. Both lungs are clear. The visualized skeletal structures are unremarkable. IMPRESSION: No active cardiopulmonary disease. Electronically Signed   By: Donavan Foil M.D.   On: 03/31/2019 21:39     ____________________________________________   PROCEDURES  Procedure(s) performed: None Procedures Critical Care performed:  None ____________________________________________   INITIAL IMPRESSION / ASSESSMENT AND PLAN / ED COURSE   58 y.o. male with a history of CAD status post MI with stents in 2016, bipolar, alcohol abuse, schizoaffective schizophrenia who presents for evaluation of chest pain.  Patient with 2 episodes of short-lived sharp chest pain lasting 30 seconds each.  Presentation atypical for ACS however with a history of prior CAD and stenting patient was evaluated with an EKG and 2 troponins which were both negative.  Patient remained asymptomatic throughout his stay in the emergency department.  Chest x-ray negative for pneumothorax, pulmonary  edema, pneumonia.  Labs within normal limits.  PE was considered but with no persistent symptoms, no tachypnea, hypoxia, tachycardia felt to be less likely.  Dissection also considered but patient was neurologically intact with a resolved symptoms, normal blood pressure, normal mediastinum on chest x-ray therefore also thought to be less likely.  Patient has been lost to follow-up since being released from prison 2 years ago.  Currently not taking any medications.  Will start patient back on an aspirin and will refer patient to cardiology for further management of his CAD.  Discussed my standard return precautions with him and his wife.       As part of my medical decision making, I reviewed the following data within the [facility] notes reviewed and incorporated, Labs reviewed , EKG interpreted , Old EKG reviewed, Old chart reviewed, Radiograph reviewed , Notes from prior ED visits and [place] Controlled Substance Database   Patient was evaluated in Emergency Department today for the symptoms described in the history of present illness. Patient was evaluated in the context of the global COVID-19 pandemic, which necessitated consideration that the patient might be at risk for infection with the SARS-CoV-2 virus that causes COVID-19. Institutional protocols and algorithms that pertain to the evaluation of patients at risk for COVID-19 are in a state of rapid change based on information released by regulatory bodies including the CDC and federal and state organizations. These policies and algorithms were followed during the patient's care in the ED.   ____________________________________________   FINAL CLINICAL IMPRESSION(S) / ED DIAGNOSES   Final diagnoses:  Atypical chest pain      NEW MEDICATIONS STARTED DURING THIS VISIT:  ED Discharge Orders         Ordered    aspirin EC 81 MG tablet  Daily     04/01/19 0016           Note:  This document was prepared using  Dragon voice recognition software and may include unintentional dictation errors.    Rudene Re, MD 04/01/19 [phone]

## 2019-04-01 NOTE — Discharge Instructions (Addendum)

## 2019-04-10 ENCOUNTER — Ambulatory Visit: Payer: Medicaid Other | Admitting: Cardiology

## 2020-03-21 ENCOUNTER — Emergency Department (HOSPITAL_COMMUNITY): Payer: Medicaid Other

## 2020-03-21 ENCOUNTER — Inpatient Hospital Stay (HOSPITAL_COMMUNITY)
Admission: EM | Admit: 2020-03-21 | Discharge: 2020-03-24 | DRG: 390 | Disposition: A | Discharge status: Home / Self Care | Payer: Medicaid Other | Attending: General Surgery | Admitting: General Surgery

## 2020-03-21 ENCOUNTER — Inpatient Hospital Stay (HOSPITAL_COMMUNITY): Payer: Medicaid Other

## 2020-03-21 ENCOUNTER — Other Ambulatory Visit: Payer: Self-pay

## 2020-03-21 ENCOUNTER — Encounter (HOSPITAL_COMMUNITY): Payer: Self-pay

## 2020-03-21 DIAGNOSIS — F259 Schizoaffective disorder, unspecified: Secondary | ICD-10-CM | POA: Diagnosis present

## 2020-03-21 DIAGNOSIS — Z7982 Long term (current) use of aspirin: Secondary | ICD-10-CM

## 2020-03-21 DIAGNOSIS — F319 Bipolar disorder, unspecified: Secondary | ICD-10-CM | POA: Diagnosis present

## 2020-03-21 DIAGNOSIS — Z23 Encounter for immunization: Secondary | ICD-10-CM

## 2020-03-21 DIAGNOSIS — F4310 Post-traumatic stress disorder, unspecified: Secondary | ICD-10-CM | POA: Diagnosis present

## 2020-03-21 DIAGNOSIS — Z7722 Contact with and (suspected) exposure to environmental tobacco smoke (acute) (chronic): Secondary | ICD-10-CM | POA: Diagnosis present

## 2020-03-21 DIAGNOSIS — K56600 Partial intestinal obstruction, unspecified as to cause: Secondary | ICD-10-CM | POA: Diagnosis not present

## 2020-03-21 DIAGNOSIS — K219 Gastro-esophageal reflux disease without esophagitis: Secondary | ICD-10-CM | POA: Diagnosis present

## 2020-03-21 DIAGNOSIS — F1010 Alcohol abuse, uncomplicated: Secondary | ICD-10-CM | POA: Diagnosis present

## 2020-03-21 DIAGNOSIS — Z955 Presence of coronary angioplasty implant and graft: Secondary | ICD-10-CM | POA: Diagnosis not present

## 2020-03-21 DIAGNOSIS — Z79899 Other long term (current) drug therapy: Secondary | ICD-10-CM | POA: Diagnosis not present

## 2020-03-21 DIAGNOSIS — Z20822 Contact with and (suspected) exposure to covid-19: Secondary | ICD-10-CM | POA: Diagnosis present

## 2020-03-21 DIAGNOSIS — K56609 Unspecified intestinal obstruction, unspecified as to partial versus complete obstruction: Secondary | ICD-10-CM

## 2020-03-21 DIAGNOSIS — I10 Essential (primary) hypertension: Secondary | ICD-10-CM | POA: Diagnosis present

## 2020-03-21 DIAGNOSIS — R1013 Epigastric pain: Secondary | ICD-10-CM | POA: Diagnosis present

## 2020-03-21 DIAGNOSIS — Z4659 Encounter for fitting and adjustment of other gastrointestinal appliance and device: Secondary | ICD-10-CM

## 2020-03-21 LAB — COMPREHENSIVE METABOLIC PANEL
ALT: 14 U/L (ref 0–44)
AST: 20 U/L (ref 15–41)
Albumin: 5.1 g/dL — ABNORMAL HIGH (ref 3.5–5.0)
Alkaline Phosphatase: 75 U/L (ref 38–126)
Anion gap: 11 (ref 5–15)
BUN: 9 mg/dL (ref 6–20)
CO2: 21 mmol/L — ABNORMAL LOW (ref 22–32)
Calcium: 10.1 mg/dL (ref 8.9–10.3)
Chloride: 105 mmol/L (ref 98–111)
Creatinine, Ser: 1.08 mg/dL (ref 0.61–1.24)
GFR, Estimated: >60 mL/min (ref >60)
Glucose, Bld: 127 mg/dL — ABNORMAL HIGH (ref 70–99)
Potassium: 4.4 mmol/L (ref 3.5–5.1)
Sodium: 137 mmol/L (ref 135–145)
Total Bilirubin: 1.2 mg/dL (ref 0.3–1.2)
Total Protein: 8.8 g/dL — ABNORMAL HIGH (ref 6.5–8.1)

## 2020-03-21 LAB — CBC WITH DIFFERENTIAL/PLATELET
Abs Immature Granulocytes: 0.07 10*3/uL (ref 0.00–0.07)
Basophils Absolute: 0 10*3/uL (ref 0.0–0.1)
Basophils Relative: 0 %
Eosinophils Absolute: 0 10*3/uL (ref 0.0–0.5)
Eosinophils Relative: 0 %
HCT: 54.5 % — ABNORMAL HIGH (ref 39.0–52.0)
Hemoglobin: 18.1 g/dL — ABNORMAL HIGH (ref 13.0–17.0)
Immature Granulocytes: 1 %
Lymphocytes Relative: 7 %
Lymphs Abs: 0.9 10*3/uL (ref 0.7–4.0)
MCH: 27.3 pg (ref 26.0–34.0)
MCHC: 33.2 g/dL (ref 30.0–36.0)
MCV: 82.2 fL (ref 80.0–100.0)
Monocytes Absolute: 1 10*3/uL (ref 0.1–1.0)
Monocytes Relative: 7 %
Neutro Abs: 11.5 10*3/uL — ABNORMAL HIGH (ref 1.7–7.7)
Neutrophils Relative %: 85 %
Platelets: 378 10*3/uL (ref 150–400)
RBC: 6.63 MIL/uL — ABNORMAL HIGH (ref 4.22–5.81)
RDW: 14.8 % (ref 11.5–15.5)
WBC: 13.5 10*3/uL — ABNORMAL HIGH (ref 4.0–10.5)
nRBC: 0 % (ref 0.0–0.2)

## 2020-03-21 LAB — RESPIRATORY PANEL BY RT PCR (FLU A&B, COVID)
Influenza A by PCR: NEGATIVE
Influenza B by PCR: NEGATIVE
SARS Coronavirus 2 by RT PCR: NEGATIVE

## 2020-03-21 LAB — LIPASE, BLOOD: Lipase: 27 U/L (ref 11–51)

## 2020-03-21 LAB — ETHANOL: Alcohol, Ethyl (B): <10 mg/dL (ref <10)

## 2020-03-21 MED ORDER — QUETIAPINE FUMARATE 50 MG PO TABS
300.0000 mg | ORAL_TABLET | Freq: Every day | ORAL | Status: DC
  Administered 2020-03-21 – 2020-03-23 (×3): 300 mg via ORAL
  Filled 2020-03-21 (×3): qty 6

## 2020-03-21 MED ORDER — DIPHENHYDRAMINE HCL 50 MG/ML IJ SOLN: 25.0000 mg | Freq: Four times a day (QID) | INTRAMUSCULAR | Status: DC | PRN

## 2020-03-21 MED ORDER — SERTRALINE HCL 50 MG PO TABS: 50.0000 mg | ORAL_TABLET | Freq: Every day | ORAL | Status: DC

## 2020-03-21 MED ORDER — ONDANSETRON HCL 4 MG/2ML IJ SOLN
4.0000 mg | Freq: Once | INTRAMUSCULAR | Status: AC
  Administered 2020-03-21: 4 mg via INTRAVENOUS
  Filled 2020-03-21: qty 2

## 2020-03-21 MED ORDER — MORPHINE SULFATE (PF) 2 MG/ML IV SOLN
2.0000 mg | Freq: Once | INTRAVENOUS | Status: AC
  Administered 2020-03-21: 2 mg via INTRAVENOUS
  Filled 2020-03-21: qty 1

## 2020-03-21 MED ORDER — KCL IN DEXTROSE-NACL 20-5-0.45 MEQ/L-%-% IV SOLN
INTRAVENOUS | Status: DC
  Filled 2020-03-21 (×5): qty 1000

## 2020-03-21 MED ORDER — ALUM & MAG HYDROXIDE-SIMETH 200-200-20 MG/5ML PO SUSP
30.0000 mL | Freq: Once | ORAL | Status: AC
  Administered 2020-03-21: 30 mL via ORAL
  Filled 2020-03-21: qty 30

## 2020-03-21 MED ORDER — TRAZODONE HCL 50 MG PO TABS: 50.0000 mg | ORAL_TABLET | Freq: Every evening | ORAL | Status: DC | PRN

## 2020-03-21 MED ORDER — LITHIUM CARBONATE 300 MG PO CAPS
300.0000 mg | ORAL_CAPSULE | ORAL | Status: DC
  Administered 2020-03-22 – 2020-03-24 (×3): 300 mg via ORAL
  Filled 2020-03-21 (×3): qty 1

## 2020-03-21 MED ORDER — ONDANSETRON 4 MG PO TBDP: 4.0000 mg | ORAL_TABLET | Freq: Four times a day (QID) | ORAL | Status: DC | PRN

## 2020-03-21 MED ORDER — IOHEXOL 300 MG/ML  SOLN
100.0000 mL | Freq: Once | INTRAMUSCULAR | Status: AC | PRN
  Administered 2020-03-21: 100 mL via INTRAVENOUS

## 2020-03-21 MED ORDER — QUETIAPINE FUMARATE 50 MG PO TABS: 200.0000 mg | ORAL_TABLET | Freq: Every day | ORAL | Status: DC

## 2020-03-21 MED ORDER — LITHIUM CARBONATE 300 MG PO CAPS
600.0000 mg | ORAL_CAPSULE | Freq: Every day | ORAL | Status: DC
  Administered 2020-03-21 – 2020-03-23 (×3): 600 mg via ORAL
  Filled 2020-03-21 (×3): qty 2

## 2020-03-21 MED ORDER — GABAPENTIN 300 MG PO CAPS
300.0000 mg | ORAL_CAPSULE | Freq: Three times a day (TID) | ORAL | Status: DC
  Administered 2020-03-21 – 2020-03-24 (×8): 300 mg via ORAL
  Filled 2020-03-21 (×8): qty 1

## 2020-03-21 MED ORDER — LORAZEPAM 1 MG PO TABS
1.0000 mg | ORAL_TABLET | ORAL | Status: DC | PRN
  Administered 2020-03-21: 20:00:00 1 mg via ORAL
  Filled 2020-03-21: qty 1

## 2020-03-21 MED ORDER — METOCLOPRAMIDE HCL 5 MG/ML IJ SOLN
10.0000 mg | Freq: Once | INTRAMUSCULAR | Status: AC
  Administered 2020-03-21: 10 mg via INTRAVENOUS
  Filled 2020-03-21: qty 2

## 2020-03-21 MED ORDER — DIPHENHYDRAMINE HCL 25 MG PO CAPS: 25.0000 mg | ORAL_CAPSULE | Freq: Four times a day (QID) | ORAL | Status: DC | PRN

## 2020-03-21 MED ORDER — MORPHINE SULFATE (PF) 2 MG/ML IV SOLN
2.0000 mg | INTRAVENOUS | Status: DC | PRN
  Administered 2020-03-21 – 2020-03-22 (×2): 2 mg via INTRAVENOUS
  Filled 2020-03-21 (×2): qty 1

## 2020-03-21 MED ORDER — ONDANSETRON HCL 4 MG/2ML IJ SOLN: 4.0000 mg | Freq: Four times a day (QID) | INTRAMUSCULAR | Status: DC | PRN

## 2020-03-21 MED ORDER — SODIUM CHLORIDE 0.9 % IV BOLUS
1000.0000 mL | Freq: Once | INTRAVENOUS | Status: AC
  Administered 2020-03-21: 1000 mL via INTRAVENOUS

## 2020-03-21 MED ORDER — ENOXAPARIN SODIUM 40 MG/0.4ML ~~LOC~~ SOLN
40.0000 mg | SUBCUTANEOUS | Status: DC
  Administered 2020-03-21 – 2020-03-23 (×3): 40 mg via SUBCUTANEOUS
  Filled 2020-03-21 (×3): qty 0.4

## 2020-03-21 MED ORDER — LIDOCAINE VISCOUS HCL 2 % MT SOLN
15.0000 mL | Freq: Once | OROMUCOSAL | Status: AC
  Administered 2020-03-21: 15 mL via ORAL
  Filled 2020-03-21: qty 15

## 2020-03-21 MED ORDER — SODIUM CHLORIDE 0.9 % IV SOLN: Freq: Once | INTRAVENOUS | Status: AC

## 2020-03-21 MED ORDER — INFLUENZA VAC SPLIT QUAD 0.5 ML IM SUSY
0.5000 mL | PREFILLED_SYRINGE | INTRAMUSCULAR | Status: AC | PRN
  Administered 2020-03-24: 0.5 mL via INTRAMUSCULAR

## 2020-03-21 NOTE — H&P (Signed)
CC: nausea, vomiting and diarrhea  Requesting provider: Dr Susy Frizzleharles Sheldon  HPI: Seth Bright is an 59 y.o. male who is here for acute onset N/V since 1am.  Denies any abd surgeries.  No abd pain  Past Medical History:  Diagnosis Date  . Alcohol abuse   . Bipolar 1 disorder (HCC)   . Herpes   . Hypertension   . PTSD (post-traumatic stress disorder)   . Schizo affective schizophrenia [facility](HCC)     Past Surgical History:  Procedure Laterality Date  . CARDIAC SURGERY     stent placement 2016    History reviewed. No pertinent family history.  Social:  reports that he is a non-smoker but has been exposed to tobacco smoke. He has never used smokeless tobacco. He reports that he does not drink alcohol and does not use drugs.  Allergies: No Known Allergies  Medications: I have reviewed the patient's current medications.  Results for orders placed or performed during the hospital encounter of 03/21/20 (from the past 48 hour(s))  CBC with Differential     Status: Abnormal   Collection Time: 03/21/20  3:53 PM  Result Value Ref Range   WBC 13.5 (H) 4.0 - 10.5 K/uL   RBC 6.63 (H) 4.22 - 5.81 MIL/uL   Hemoglobin 18.1 (H) 13.0 - 17.0 g/dL   HCT 16.154.5 (H) 39 - 52 %   MCV 82.2 80.0 - 100.0 fL   MCH 27.3 26.0 - 34.0 pg   MCHC 33.2 30.0 - 36.0 g/dL   RDW 09.614.8 04.511.5 - 40.915.5 %   Platelets 378 150 - 400 K/uL   nRBC 0.0 0.0 - 0.2 %   Neutrophils Relative % 85 %   Neutro Abs 11.5 (H) 1.7 - 7.7 K/uL   Lymphocytes Relative 7 %   Lymphs Abs 0.9 0.7 - 4.0 K/uL   Monocytes Relative 7 %   Monocytes Absolute 1.0 0.1 - 1.0 K/uL   Eosinophils Relative 0 %   Eosinophils Absolute 0.0 0.0 - 0.5 K/uL   Basophils Relative 0 %   Basophils Absolute 0.0 0.0 - 0.1 K/uL   Immature Granulocytes 1 %   Abs Immature Granulocytes 0.07 0.00 - 0.07 K/uL    Comment: Performed at [facility], [address] [place], [place] [id number]  Comprehensive metabolic panel     Status: Abnormal     Collection Time: 03/21/20  3:53 PM  Result Value Ref Range   Sodium 137 135 - 145 mmol/L   Potassium 4.4 3.5 - 5.1 mmol/L   Chloride 105 98 - 111 mmol/L   CO2 21 (L) 22 - 32 mmol/L   Glucose, Bld 127 (H) 70 - 99 mg/dL    Comment: Glucose reference range applies only to samples taken after fasting for at least 8 hours.   BUN 9 6 - 20 mg/dL   Creatinine, Ser 4.781.08 0.61 - 1.24 mg/dL   Calcium 29.510.1 8.9 - 62.110.3 mg/dL   Total Protein 8.8 (H) 6.5 - 8.1 g/dL   Albumin 5.1 (H) 3.5 - 5.0 g/dL   AST 20 15 - 41 U/L   ALT 14 0 - 44 U/L   Alkaline Phosphatase 75 38 - 126 U/L   Total Bilirubin 1.2 0.3 - 1.2 mg/dL   GFR, Estimated >30>60 >86>60 mL/min    Comment: (NOTE) Calculated using the CKD-EPI Creatinine Equation (2021)    Anion gap 11 5 - 15    Comment: Performed at [facility], [address], [place], [place] [address]  Lipase, blood     Status: None   Collection Time: 03/21/20  3:53 PM  Result Value Ref Range   Lipase 27 11 - 51 U/L    Comment: Performed at [facility], [address] [place], [place] [address]  Ethanol     Status: None   Collection Time: 03/21/20  3:53 PM  Result Value Ref Range   Alcohol, Ethyl (B) <10 <10 mg/dL    Comment: (NOTE) Lowest detectable limit for serum alcohol is 10 mg/dL.  For medical purposes only. Performed at [facility], [address] [place], [place] [address]     CT ABDOMEN PELVIS W CONTRAST  Result Date: 03/21/2020 CLINICAL DATA:  Epigastric pain. EXAM: CT ABDOMEN AND PELVIS WITH CONTRAST TECHNIQUE: Multidetector CT imaging of the abdomen and pelvis was performed using the standard protocol following bolus administration of intravenous contrast. CONTRAST:  OMNIPAQUE IOHEXOL 300 MG/ML  SOLN COMPARISON:  November 06, 2017 FINDINGS: Lower chest: No acute abnormality. Hepatobiliary: No focal liver abnormality is seen. No gallstones, gallbladder wall thickening, or biliary dilatation.  Pancreas: Unremarkable. No pancreatic ductal dilatation or surrounding inflammatory changes. Spleen: Normal in size without focal abnormality. Adrenals/Urinary Tract: Adrenal glands are unremarkable. Kidneys are normal, without renal calculi, focal lesion, or hydronephrosis. Bladder is unremarkable. Stomach/Bowel: There is a small hiatal hernia with mild thickening of the visualized portion of the distal esophagus. Appendix appears normal. Mildly dilated small bowel loops are seen within the abdomen on the left (maximum small bowel diameter of approximately 3.1 cm). A transition zone is seen within the right lower quadrant (axial CT images 62 through 68, CT series number 2). Vascular/Lymphatic: No significant vascular findings are present. No enlarged abdominal or pelvic lymph nodes. Reproductive: There is mild to moderate severity prostate gland enlargement. Multiple small prostate gland calcifications are seen. Other: No abdominal wall hernia or abnormality. No abdominopelvic ascites. Musculoskeletal: No acute or significant osseous findings. IMPRESSION: 1. Findings consistent with a small bowel obstruction. 2. Small hiatal hernia with mild thickening of the visualized portion of the distal esophagus which may represent esophagitis. 3. Mild to moderate severity prostate gland enlargement. Electronically Signed   By: Aram Candela M.D.   On: 03/21/2020 17:43   DG Chest Portable 1 View  Result Date: 03/21/2020 CLINICAL DATA:  Epigastric/chest pain. EXAM: PORTABLE CHEST 1 VIEW COMPARISON:  March 31, 2019 FINDINGS: The heart size and mediastinal contours are within normal limits. Both lungs are clear. No visible pleural effusions or pneumothorax. Similar mildly elevated left hemidiaphragm. The visualized skeletal structures are unremarkable. IMPRESSION: No acute cardiopulmonary disease. Electronically Signed   By: Feliberto Harts MD   On: 03/21/2020 15:49    ROS - all of the below systems have been  reviewed with the patient and positives are indicated with bold text General: chills, fever or night sweats Eyes: blurry vision or double vision ENT: epistaxis or sore throat Allergy/Immunology: itchy/watery eyes or nasal congestion Hematologic/Lymphatic: bleeding problems, blood clots or swollen lymph nodes Endocrine: temperature intolerance or unexpected weight changes Breast: new or changing breast lumps or nipple discharge Resp: cough, shortness of breath, or wheezing CV: chest pain or dyspnea on exertion GI: as per HPI GU: dysuria, trouble voiding, or hematuria MSK: joint pain or joint stiffness Neuro: TIA or stroke symptoms Derm: pruritus and skin lesion changes Psych: anxiety and depression  PE Blood pressure (!) 131/93, pulse 100, temperature 98 F (36.7 C), temperature source  Oral, resp. rate 20, SpO2 93 %. Constitutional: NAD; conversant; no deformities Eyes: Moist conjunctiva; no lid lag; anicteric; PERRL Neck: Trachea midline; no thyromegaly Lungs: Normal respiratory effort; no tactile fremitus CV: RRR; no palpable thrills; no pitting edema GI: Abd soft, distended; no palpable hepatosplenomegaly MSK: Normal range of motion of extremities; no clubbing/cyanosis Psychiatric: Appropriate affect; alert and oriented x3 Lymphatic: No palpable cervical or axillary lymphadenopathy  Results for orders placed or performed during the hospital encounter of 03/21/20 (from the past 48 hour(s))  CBC with Differential     Status: Abnormal   Collection Time: 03/21/20  3:53 PM  Result Value Ref Range   WBC 13.5 (H) 4.0 - 10.5 K/uL   RBC 6.63 (H) 4.22 - 5.81 MIL/uL   Hemoglobin 18.1 (H) 13.0 - 17.0 g/dL   HCT 52.7 (H) 39 - 52 %   MCV 82.2 80.0 - 100.0 fL   MCH 27.3 26.0 - 34.0 pg   MCHC 33.2 30.0 - 36.0 g/dL   RDW 78.2 42.3 - 53.6 %   Platelets 378 150 - 400 K/uL   nRBC 0.0 0.0 - 0.2 %   Neutrophils Relative % 85 %   Neutro Abs 11.5 (H) 1.7 - 7.7 K/uL   Lymphocytes Relative 7 %     Lymphs Abs 0.9 0.7 - 4.0 K/uL   Monocytes Relative 7 %   Monocytes Absolute 1.0 0.1 - 1.0 K/uL   Eosinophils Relative 0 %   Eosinophils Absolute 0.0 0.0 - 0.5 K/uL   Basophils Relative 0 %   Basophils Absolute 0.0 0.0 - 0.1 K/uL   Immature Granulocytes 1 %   Abs Immature Granulocytes 0.07 0.00 - 0.07 K/uL    Comment: Performed at [facility], [address], [place], [place] [address]  Comprehensive metabolic panel     Status: Abnormal   Collection Time: 03/21/20  3:53 PM  Result Value Ref Range   Sodium 137 135 - 145 mmol/L   Potassium 4.4 3.5 - 5.1 mmol/L   Chloride 105 98 - 111 mmol/L   CO2 21 (L) 22 - 32 mmol/L   Glucose, Bld 127 (H) 70 - 99 mg/dL    Comment: Glucose reference range applies only to samples taken after fasting for at least 8 hours.   BUN 9 6 - 20 mg/dL   Creatinine, Ser 5.40 0.61 - 1.24 mg/dL   Calcium 08.6 8.9 - 76.1 mg/dL   Total Protein 8.8 (H) 6.5 - 8.1 g/dL   Albumin 5.1 (H) 3.5 - 5.0 g/dL   AST 20 15 - 41 U/L   ALT 14 0 - 44 U/L   Alkaline Phosphatase 75 38 - 126 U/L   Total Bilirubin 1.2 0.3 - 1.2 mg/dL   GFR, Estimated >95 >09 mL/min    Comment: (NOTE) Calculated using the CKD-EPI Creatinine Equation (2021)    Anion gap 11 5 - 15    Comment: Performed at [facility], [address], [place], [place] [address]  Lipase, blood     Status: None   Collection Time: 03/21/20  3:53 PM  Result Value Ref Range   Lipase 27 11 - 51 U/L    Comment: Performed at [facility], [address], [place], [place] [address]  Ethanol     Status: None   Collection Time: 03/21/20  3:53 PM  Result Value Ref Range   Alcohol, Ethyl (B) <10 <10 mg/dL    Comment: (NOTE) Lowest detectable limit for  serum alcohol is 10 mg/dL.  For medical purposes only. Performed at [facility], [address], [place], [place] [address]     CT ABDOMEN PELVIS W CONTRAST  Result Date:  03/21/2020 CLINICAL DATA:  Epigastric pain. EXAM: CT ABDOMEN AND PELVIS WITH CONTRAST TECHNIQUE: Multidetector CT imaging of the abdomen and pelvis was performed using the standard protocol following bolus administration of intravenous contrast. CONTRAST:  OMNIPAQUE IOHEXOL 300 MG/ML  SOLN COMPARISON:  November 06, 2017 FINDINGS: Lower chest: No acute abnormality. Hepatobiliary: No focal liver abnormality is seen. No gallstones, gallbladder wall thickening, or biliary dilatation. Pancreas: Unremarkable. No pancreatic ductal dilatation or surrounding inflammatory changes. Spleen: Normal in size without focal abnormality. Adrenals/Urinary Tract: Adrenal glands are unremarkable. Kidneys are normal, without renal calculi, focal lesion, or hydronephrosis. Bladder is unremarkable. Stomach/Bowel: There is a small hiatal hernia with mild thickening of the visualized portion of the distal esophagus. Appendix appears normal. Mildly dilated small bowel loops are seen within the abdomen on the left (maximum small bowel diameter of approximately 3.1 cm). A transition zone is seen within the right lower quadrant (axial CT images 62 through 68, CT series number 2). Vascular/Lymphatic: No significant vascular findings are present. No enlarged abdominal or pelvic lymph nodes. Reproductive: There is mild to moderate severity prostate gland enlargement. Multiple small prostate gland calcifications are seen. Other: No abdominal wall hernia or abnormality. No abdominopelvic ascites. Musculoskeletal: No acute or significant osseous findings. IMPRESSION: 1. Findings consistent with a small bowel obstruction. 2. Small hiatal hernia with mild thickening of the visualized portion of the distal esophagus which may represent esophagitis. 3. Mild to moderate severity prostate gland enlargement. Electronically Signed   By: Aram Candela M.D.   On: 03/21/2020 17:43   DG Chest Portable 1 View  Result Date: 03/21/2020 CLINICAL DATA:   Epigastric/chest pain. EXAM: PORTABLE CHEST 1 VIEW COMPARISON:  March 31, 2019 FINDINGS: The heart size and mediastinal contours are within normal limits. Both lungs are clear. No visible pleural effusions or pneumothorax. Similar mildly elevated left hemidiaphragm. The visualized skeletal structures are unremarkable. IMPRESSION: No acute cardiopulmonary disease. Electronically Signed   By: Feliberto Harts MD   On: 03/21/2020 15:49     A/P: Seth Bright is an 59 y.o. male with N/V and CT showing SBO with transition point in RLQ.  No PSH.  Insert NG Will start SBO protocol on AM NPO except meds   Vanita Panda, MD  Colorectal and General Surgery [facility]

## 2020-03-21 NOTE — ED Provider Notes (Signed)
Patient seen and examined, agree with assessment and plan by APP. Patient here with vomiting and abdominal discomfort. CT shows SBO, no prior abdominal surgery. Surgery to admit, NG tube and hydration.    Pollyann Savoy, MD 03/21/20 [phone]

## 2020-03-21 NOTE — ED Provider Notes (Signed)
[facility]-EMERGENCY DEPT Provider Note   CSN: [id number] Arrival date & time: 03/21/20  1507     History Chief Complaint  Patient presents with  . Emesis    Seth Bright is a 59 y.o. male.  HPI 59 year old male with a history of alcohol abuse, bipolar 1 disorder, hypertension, PTSD, schizoaffective schizophrenia who presents to the ER with complaints of epigastric pain radiating up into his chest, nausea, nonbloody nonbilious vomiting, nonbloody diarrhea since 1 AM.  Patient states he has not been able to keep much food or water down.  Denies any history of abdominal surgeries.  Denies any shortness of breath.  States he has a history of gastric reflux but this is more intense.  He has not taken anything for his symptoms.  He states that he did have 1 beer last night, but denies any recent chronic alcohol use.  Denies any illicit substance use.  Denies any fevers, chills, dysuria, cough, loss of taste or smell, nasal congestion.    Past Medical History:  Diagnosis Date  . Alcohol abuse   . Bipolar 1 disorder (HCC)   . Herpes   . Hypertension   . PTSD (post-traumatic stress disorder)   . Schizo affective schizophrenia [facility](HCC)     Patient Active Problem List   Diagnosis Date Noted  . SBO (small bowel obstruction) (HCC) 03/21/2020  . PTSD (post-traumatic stress disorder)   . Bipolar affective disorder, current episode manic (HCC) 05/31/2018    Past Surgical History:  Procedure Laterality Date  . CARDIAC SURGERY     stent placement 2016       History reviewed. No pertinent family history.  Social History   Tobacco Use  . Smoking status: Passive Smoke Exposure - Never Smoker  . Smokeless tobacco: Never Used  Vaping Use  . Vaping Use: Never used  Substance Use Topics  . Alcohol use: No  . Drug use: No    Home Medications Prior to Admission medications   Medication Sig Start Date End Date Taking? Authorizing Provider  aspirin EC 81 MG  tablet Take 1 tablet (81 mg total) by mouth daily. 04/01/19 03/31/20  Nita SickleVeronese, [place], MD  gabapentin (NEURONTIN) 300 MG capsule Take 1 capsule (300 mg total) by mouth 3 (three) times daily. For anxiety/agitation 06/07/18   Aldean BakerSykes, Janet E, NP  lithium carbonate 300 MG capsule Take 1 capsule (300 mg total) by mouth every morning. For mood 06/08/18   Aldean BakerSykes, Janet E, NP  lithium carbonate 600 MG capsule Take 1 capsule (600 mg total) by mouth at bedtime. For mood 06/07/18   Aldean BakerSykes, Janet E, NP  LORazepam (ATIVAN) 1 MG tablet Take 1 tablet (1 mg total) by mouth every 4 (four) hours as needed for anxiety (agitation). 06/07/18   Aldean BakerSykes, Janet E, NP  neomycin-bacitracin-polymyxin (NEOSPORIN) OINT Apply 1 application topically as needed for wound care. (May buy over the counter) 06/07/18   Aldean BakerSykes, Janet E, NP  QUEtiapine (SEROQUEL) 200 MG tablet Take 1 tablet (200 mg total) by mouth at bedtime. For mood/sleep 06/07/18   Aldean BakerSykes, Janet E, NP  sertraline (ZOLOFT) 50 MG tablet Take 1 tablet (50 mg total) by mouth daily. For mood 06/08/18   Aldean BakerSykes, Janet E, NP  traZODone (DESYREL) 50 MG tablet Take 1 tablet (50 mg total) by mouth at bedtime as needed for sleep. 06/07/18   Aldean BakerSykes, Janet E, NP    Allergies    Patient has no known allergies.  Review of Systems  Review of Systems  Constitutional: Negative for chills and fever.  HENT: Negative for ear pain and sore throat.   Eyes: Negative for pain and visual disturbance.  Respiratory: Negative for cough and shortness of breath.   Cardiovascular: Negative for chest pain and palpitations.  Gastrointestinal: Positive for diarrhea, nausea and vomiting. Negative for abdominal pain.  Genitourinary: Negative for dysuria and hematuria.  Musculoskeletal: Negative for arthralgias and back pain.  Skin: Negative for color change and rash.  Neurological: Negative for seizures and syncope.  All other systems reviewed and are negative.   Physical Exam Updated Vital Signs BP (!)  131/93   Pulse 100   Temp 98 F (36.7 C) (Oral)   Resp 20   SpO2 93%   Physical Exam Vitals and nursing note reviewed.  Constitutional:      General: He is not in acute distress.    Appearance: He is well-developed. He is not ill-appearing, toxic-appearing or diaphoretic.  HENT:     Head: Normocephalic and atraumatic.     Mouth/Throat:     Mouth: Mucous membranes are moist.     Pharynx: Oropharynx is clear.  Eyes:     Conjunctiva/sclera: Conjunctivae normal.  Cardiovascular:     Rate and Rhythm: Normal rate and regular rhythm.     Pulses: Normal pulses.     Heart sounds: Normal heart sounds. No murmur heard.   Pulmonary:     Effort: Pulmonary effort is normal. No respiratory distress.     Breath sounds: Normal breath sounds.  Chest:     Chest wall: No tenderness.  Abdominal:     General: There is no distension.     Palpations: Abdomen is soft.     Tenderness: There is abdominal tenderness (Epigastric tenderness). There is no right CVA tenderness, left CVA tenderness, guarding or rebound.  Musculoskeletal:        General: No swelling, tenderness or deformity.     Cervical back: Normal range of motion and neck supple.  Skin:    General: Skin is warm and dry.     Findings: No erythema.  Neurological:     General: No focal deficit present.     Mental Status: He is alert.     ED Results / Procedures / Treatments   Labs (all labs ordered are listed, but only abnormal results are displayed) Labs Reviewed  CBC WITH DIFFERENTIAL/PLATELET - Abnormal; Notable for the following components:      Result Value   WBC 13.5 (*)    RBC 6.63 (*)    Hemoglobin 18.1 (*)    HCT 54.5 (*)    Neutro Abs 11.5 (*)    All other components within normal limits  COMPREHENSIVE METABOLIC PANEL - Abnormal; Notable for the following components:   CO2 21 (*)    Glucose, Bld 127 (*)    Total Protein 8.8 (*)    Albumin 5.1 (*)    All other components within normal limits  RESPIRATORY PANEL  BY RT PCR (FLU A&B, COVID)  LIPASE, BLOOD  ETHANOL  URINALYSIS, ROUTINE W REFLEX MICROSCOPIC  HIV ANTIBODY (ROUTINE TESTING W REFLEX)  BASIC METABOLIC PANEL  CBC    EKG None  Radiology CT ABDOMEN PELVIS W CONTRAST  Result Date: 03/21/2020 CLINICAL DATA:  Epigastric pain. EXAM: CT ABDOMEN AND PELVIS WITH CONTRAST TECHNIQUE: Multidetector CT imaging of the abdomen and pelvis was performed using the standard protocol following bolus administration of intravenous contrast. CONTRAST:  OMNIPAQUE IOHEXOL 300 MG/ML  SOLN  COMPARISON:  November 06, 2017 FINDINGS: Lower chest: No acute abnormality. Hepatobiliary: No focal liver abnormality is seen. No gallstones, gallbladder wall thickening, or biliary dilatation. Pancreas: Unremarkable. No pancreatic ductal dilatation or surrounding inflammatory changes. Spleen: Normal in size without focal abnormality. Adrenals/Urinary Tract: Adrenal glands are unremarkable. Kidneys are normal, without renal calculi, focal lesion, or hydronephrosis. Bladder is unremarkable. Stomach/Bowel: There is a small hiatal hernia with mild thickening of the visualized portion of the distal esophagus. Appendix appears normal. Mildly dilated small bowel loops are seen within the abdomen on the left (maximum small bowel diameter of approximately 3.1 cm). A transition zone is seen within the right lower quadrant (axial CT images 62 through 68, CT series number 2). Vascular/Lymphatic: No significant vascular findings are present. No enlarged abdominal or pelvic lymph nodes. Reproductive: There is mild to moderate severity prostate gland enlargement. Multiple small prostate gland calcifications are seen. Other: No abdominal wall hernia or abnormality. No abdominopelvic ascites. Musculoskeletal: No acute or significant osseous findings. IMPRESSION: 1. Findings consistent with a small bowel obstruction. 2. Small hiatal hernia with mild thickening of the visualized portion of the distal  esophagus which may represent esophagitis. 3. Mild to moderate severity prostate gland enlargement. Electronically Signed   By: Aram Candela M.D.   On: 03/21/2020 17:43   DG Chest Portable 1 View  Result Date: 03/21/2020 CLINICAL DATA:  Epigastric/chest pain. EXAM: PORTABLE CHEST 1 VIEW COMPARISON:  March 31, 2019 FINDINGS: The heart size and mediastinal contours are within normal limits. Both lungs are clear. No visible pleural effusions or pneumothorax. Similar mildly elevated left hemidiaphragm. The visualized skeletal structures are unremarkable. IMPRESSION: No acute cardiopulmonary disease. Electronically Signed   By: Feliberto Harts MD   On: 03/21/2020 15:49    Procedures Procedures (including critical care time)  Medications Ordered in ED Medications  morphine 2 MG/ML injection 2 mg (has no administration in time range)  metoCLOPramide (REGLAN) injection 10 mg (has no administration in time range)  lithium carbonate capsule 300 mg (has no administration in time range)  lithium carbonate capsule 600 mg (has no administration in time range)  LORazepam (ATIVAN) tablet 1 mg (has no administration in time range)  QUEtiapine (SEROQUEL) tablet 200 mg (has no administration in time range)  sertraline (ZOLOFT) tablet 50 mg (has no administration in time range)  traZODone (DESYREL) tablet 50 mg (has no administration in time range)  gabapentin (NEURONTIN) capsule 300 mg (has no administration in time range)  enoxaparin (LOVENOX) injection 40 mg (has no administration in time range)  dextrose 5 % and 0.45 % NaCl with KCl 20 mEq/L infusion (has no administration in time range)  morphine 2 MG/ML injection 2 mg (has no administration in time range)  diphenhydrAMINE (BENADRYL) capsule 25 mg (has no administration in time range)    Or  diphenhydrAMINE (BENADRYL) injection 25 mg (has no administration in time range)  ondansetron (ZOFRAN-ODT) disintegrating tablet 4 mg (has no  administration in time range)    Or  ondansetron (ZOFRAN) injection 4 mg (has no administration in time range)  0.9 %  sodium chloride infusion (has no administration in time range)  ondansetron (ZOFRAN) injection 4 mg (4 mg Intravenous Given 03/21/20 1555)  sodium chloride 0.9 % bolus 1,000 mL (0 mLs Intravenous Stopped 03/21/20 1805)  alum & mag hydroxide-simeth (MAALOX/MYLANTA) 200-200-20 MG/5ML suspension 30 mL (30 mLs Oral Given 03/21/20 1714)    And  lidocaine (XYLOCAINE) 2 % viscous mouth solution 15 mL (15 mLs Oral Given  03/21/20 1714)  iohexol (OMNIPAQUE) 300 MG/ML solution 100 mL (100 mLs Intravenous Contrast Given 03/21/20 1722)    ED Course  I have reviewed the triage vital signs and the nursing notes.  Pertinent labs & imaging results that were available during my care of the patient were reviewed by me and considered in my medical decision making (see chart for details).    MDM Rules/Calculators/A&P                         59 year old male with complaints of nausea, vomiting, diarrhea since 1 AM this morning On presentation, he is alert, oriented, nontoxic-appearing, no acute distress, resting comfortably in the ER bed.  Physical exam with epigastric tenderness, no right lower quadrant or left lower quadrant tenderness.  No rebound or guarding.  Vitals overall reassuring, afebrile.  DDx includes gastroenteritis, small bowel obstruction, incarcerated hernia, reflux, Covid.  CBC with a leukocytosis of 13.5, could be signs of infection or reactive secondary to vomiting.  CMP with no significant electrolyte abnormalities, normal renal function and LFTs.  Chest x-ray without acute abnormalities.  EKG with sinus tach but no other significant abnormalities.  Ethanol is negative.  CT of the abdomen consistent with small bowel obstruction.  Consulted general surgery Dr. Maisie Fus who will admit the patient for further evaluation and treatment.  Patient started on maintenance fluids, provided  morphine and Reglan for nausea.  NG tube and n.p.o.  Hemodynamically stable at this time.  Case discussed with Dr. Bernette Mayers who is agreeable to the above plan and disposition. Final Clinical Impression(s) / ED Diagnoses Final diagnoses:  Small bowel obstruction [facility])    Rx / DC Orders ED Discharge Orders    None       Leone Brand 03/21/20 1812    Pollyann Savoy, MD 03/21/20 [id number]

## 2020-03-21 NOTE — ED Triage Notes (Signed)
Pt arrived via walk in, c/o n/v and diarrhea since 1am this morning. C/o epigatric pain, that was radiating to chest earlier today. Denies any sick contacts.

## 2020-03-22 ENCOUNTER — Inpatient Hospital Stay (HOSPITAL_COMMUNITY): Payer: Medicaid Other

## 2020-03-22 LAB — BASIC METABOLIC PANEL
Anion gap: 9 (ref 5–15)
BUN: 9 mg/dL (ref 6–20)
CO2: 22 mmol/L (ref 22–32)
Calcium: 8.9 mg/dL (ref 8.9–10.3)
Chloride: 106 mmol/L (ref 98–111)
Creatinine, Ser: 0.99 mg/dL (ref 0.61–1.24)
GFR, Estimated: >60 mL/min (ref >60)
Glucose, Bld: 126 mg/dL — ABNORMAL HIGH (ref 70–99)
Potassium: 4.5 mmol/L (ref 3.5–5.1)
Sodium: 137 mmol/L (ref 135–145)

## 2020-03-22 LAB — CBC
HCT: 46.9 % (ref 39.0–52.0)
Hemoglobin: 15.5 g/dL (ref 13.0–17.0)
MCH: 27.1 pg (ref 26.0–34.0)
MCHC: 33 g/dL (ref 30.0–36.0)
MCV: 81.8 fL (ref 80.0–100.0)
Platelets: 324 10*3/uL (ref 150–400)
RBC: 5.73 MIL/uL (ref 4.22–5.81)
RDW: 14 % (ref 11.5–15.5)
WBC: 10.1 10*3/uL (ref 4.0–10.5)
nRBC: 0 % (ref 0.0–0.2)

## 2020-03-22 LAB — HIV ANTIBODY (ROUTINE TESTING W REFLEX): HIV Screen 4th Generation wRfx: NONREACTIVE

## 2020-03-22 MED ORDER — DIATRIZOATE MEGLUMINE & SODIUM 66-10 % PO SOLN
90.0000 mL | Freq: Once | ORAL | Status: AC
  Administered 2020-03-22: 09:00:00 90 mL via NASOGASTRIC
  Filled 2020-03-22: qty 90

## 2020-03-22 NOTE — Progress Notes (Signed)
[facility] Progress Note     Subjective: CC-  Feeling a little better than yesterday. Abdominal pain and bloating are somewhat improved. Rates pain as 3 out of 10. Mild nausea, no emesis. He had 2 loose stools over night, and has passed some flatus with BMs. NG tube with 350cc output.  Objective: Vital signs in last 24 hours: Temp:  [98 F (36.7 C)-98.6 F (37 C)] 98.4 F (36.9 C) (10/25 0645) Pulse Rate:  [89-117] 89 (10/25 0645) Resp:  [16-20] 16 (10/25 0645) BP: (114-139)/(83-95) 120/85 (10/25 0645) SpO2:  [93 %-95 %] 95 % (10/25 0645) Weight:  [81.6 kg] 81.6 kg (10/24 2010) Last BM Date: 03/21/20  Intake/Output from previous day: 10/24 0701 - 10/25 0700 In: 825.7 [I.V.:825.7] Out: 350 [Emesis/NG output:350] Intake/Output this shift: No intake/output data recorded.  PE: Gen:  Alert, NAD, pleasant HEENT: EOM's intact, pupils equal and round Card:  RRR Pulm:  CTAB, no W/R/R, rate and effort normal Abd: Soft, mild distension, mild epigastric TTP without rebound or guarding, hypoactive BS Psych: A&Ox4  Skin: no rashes noted, warm and dry  Lab Results:  Recent Labs    03/21/20 1553 03/22/20 0518  WBC 13.5* 10.1  HGB 18.1* 15.5  HCT 54.5* 46.9  PLT 378 324   BMET Recent Labs    03/21/20 1553 03/22/20 0518  NA 137 137  K 4.4 4.5  CL 105 106  CO2 21* 22  GLUCOSE 127* 126*  BUN 9 9  CREATININE 1.08 0.99  CALCIUM 10.1 8.9   PT/INR No results for input(s): LABPROT, INR in the last 72 hours. CMP     Component Value Date/Time   NA 137 03/22/2020 0518   K 4.5 03/22/2020 0518   CL 106 03/22/2020 0518   CO2 22 03/22/2020 0518   GLUCOSE 126 (H) 03/22/2020 0518   BUN 9 03/22/2020 0518   CREATININE 0.99 03/22/2020 0518   CALCIUM 8.9 03/22/2020 0518   PROT 8.8 (H) 03/21/2020 1553   ALBUMIN 5.1 (H) 03/21/2020 1553   AST 20 03/21/2020 1553   ALT 14 03/21/2020 1553   ALKPHOS 75 03/21/2020 1553   BILITOT 1.2 03/21/2020 1553   GFRNONAA >60  03/22/2020 0518   GFRAA >60 03/31/2019 2126   Lipase     Component Value Date/Time   LIPASE 27 03/21/2020 1553       Studies/Results: CT ABDOMEN PELVIS W CONTRAST  Result Date: 03/21/2020 CLINICAL DATA:  Epigastric pain. EXAM: CT ABDOMEN AND PELVIS WITH CONTRAST TECHNIQUE: Multidetector CT imaging of the abdomen and pelvis was performed using the standard protocol following bolus administration of intravenous contrast. CONTRAST:  OMNIPAQUE IOHEXOL 300 MG/ML  SOLN COMPARISON:  November 06, 2017 FINDINGS: Lower chest: No acute abnormality. Hepatobiliary: No focal liver abnormality is seen. No gallstones, gallbladder wall thickening, or biliary dilatation. Pancreas: Unremarkable. No pancreatic ductal dilatation or surrounding inflammatory changes. Spleen: Normal in size without focal abnormality. Adrenals/Urinary Tract: Adrenal glands are unremarkable. Kidneys are normal, without renal calculi, focal lesion, or hydronephrosis. Bladder is unremarkable. Stomach/Bowel: There is a small hiatal hernia with mild thickening of the visualized portion of the distal esophagus. Appendix appears normal. Mildly dilated small bowel loops are seen within the abdomen on the left (maximum small bowel diameter of approximately 3.1 cm). A transition zone is seen within the right lower quadrant (axial CT images 62 through 68, CT series number 2). Vascular/Lymphatic: No significant vascular findings are present. No enlarged abdominal or pelvic lymph nodes. Reproductive: There is  mild to moderate severity prostate gland enlargement. Multiple small prostate gland calcifications are seen. Other: No abdominal wall hernia or abnormality. No abdominopelvic ascites. Musculoskeletal: No acute or significant osseous findings. IMPRESSION: 1. Findings consistent with a small bowel obstruction. 2. Small hiatal hernia with mild thickening of the visualized portion of the distal esophagus which may represent esophagitis. 3. Mild to  moderate severity prostate gland enlargement. Electronically Signed   By: Aram Candela M.D.   On: 03/21/2020 17:43   DG Chest Portable 1 View  Result Date: 03/21/2020 CLINICAL DATA:  Epigastric/chest pain. EXAM: PORTABLE CHEST 1 VIEW COMPARISON:  March 31, 2019 FINDINGS: The heart size and mediastinal contours are within normal limits. Both lungs are clear. No visible pleural effusions or pneumothorax. Similar mildly elevated left hemidiaphragm. The visualized skeletal structures are unremarkable. IMPRESSION: No acute cardiopulmonary disease. Electronically Signed   By: Feliberto Harts MD   On: 03/21/2020 15:49   DG Abd Portable 1V  Result Date: 03/21/2020 CLINICAL DATA:  NG tube placement EXAM: PORTABLE ABDOMEN - 1 VIEW COMPARISON:  None. FINDINGS: The NG tube projects over the gastric body. The stomach is distended. There is non gaseous distention of loops of colon in the upper abdomen. IMPRESSION: NG tube projects over the gastric body. Electronically Signed   By: Katherine Mantle M.D.   On: 03/21/2020 21:20    Anti-infectives: Anti-infectives (From admission, onward)   None       Assessment/Plan Bipolar - home meds  Partial SBO - no PSH - CT showing SBO with transition point in RLQ - Start small bowel protocol today.  ID - none FEN - IVF, NPO/NGT to LIWS VTE - SCDs, lovenox Foley - none Follow up - TBD    LOS: 1 day    Franne Forts, [facility] 03/22/2020, 8:39 AM Please see Amion for pager number during day hours 7:00am-4:30pm

## 2020-03-23 MED ORDER — PANTOPRAZOLE SODIUM 40 MG PO TBEC
40.0000 mg | DELAYED_RELEASE_TABLET | Freq: Every day | ORAL | Status: DC
  Administered 2020-03-23 – 2020-03-24 (×2): 40 mg via ORAL
  Filled 2020-03-23 (×2): qty 1

## 2020-03-23 NOTE — Progress Notes (Signed)
[facility] Progress Note     Subjective: CC-  Feeling better than yesterday. Still bloated but less abdominal pain and nausea. NG tube with 250cc output. States that he had 1 loose BM yesterday. Xray shows contrast in colon and rectum with less central small bowel distension.  Objective: Vital signs in last 24 hours: Temp:  [97.7 F (36.5 C)-98.4 F (36.9 C)] 98.3 F (36.8 C) (10/26 0539) Pulse Rate:  [85-94] 85 (10/26 0539) Resp:  [16-18] 18 (10/26 0539) BP: (122-137)/(87-111) 129/92 (10/26 0539) SpO2:  [94 %-96 %] 94 % (10/26 0539) Last BM Date: 03/21/20  Intake/Output from previous day: 10/25 0701 - 10/26 0700 In: 2035.2 [P.O.:160; I.V.:1785.2; NG/GT:90] Out: 750 [Urine:500; Emesis/NG output:250] Intake/Output this shift: No intake/output data recorded.  PE: Gen:  Alert, NAD, pleasant Card:  RRR Pulm:  CTAB, no W/R/R, rate and effort normal Abd: Soft, mild distension, nontender, + BS Psych: A&Ox4  Skin: no rashes noted, warm and dry   Lab Results:  Recent Labs    03/21/20 1553 03/22/20 0518  WBC 13.5* 10.1  HGB 18.1* 15.5  HCT 54.5* 46.9  PLT 378 324   BMET Recent Labs    03/21/20 1553 03/22/20 0518  NA 137 137  K 4.4 4.5  CL 105 106  CO2 21* 22  GLUCOSE 127* 126*  BUN 9 9  CREATININE 1.08 0.99  CALCIUM 10.1 8.9   PT/INR No results for input(s): LABPROT, INR in the last 72 hours. CMP     Component Value Date/Time   NA 137 03/22/2020 0518   K 4.5 03/22/2020 0518   CL 106 03/22/2020 0518   CO2 22 03/22/2020 0518   GLUCOSE 126 (H) 03/22/2020 0518   BUN 9 03/22/2020 0518   CREATININE 0.99 03/22/2020 0518   CALCIUM 8.9 03/22/2020 0518   PROT 8.8 (H) 03/21/2020 1553   ALBUMIN 5.1 (H) 03/21/2020 1553   AST 20 03/21/2020 1553   ALT 14 03/21/2020 1553   ALKPHOS 75 03/21/2020 1553   BILITOT 1.2 03/21/2020 1553   GFRNONAA >60 03/22/2020 0518   GFRAA >60 03/31/2019 2126   Lipase     Component Value Date/Time   LIPASE 27  03/21/2020 1553       Studies/Results: CT ABDOMEN PELVIS W CONTRAST  Result Date: 03/21/2020 CLINICAL DATA:  Epigastric pain. EXAM: CT ABDOMEN AND PELVIS WITH CONTRAST TECHNIQUE: Multidetector CT imaging of the abdomen and pelvis was performed using the standard protocol following bolus administration of intravenous contrast. CONTRAST:  OMNIPAQUE IOHEXOL 300 MG/ML  SOLN COMPARISON:  November 06, 2017 FINDINGS: Lower chest: No acute abnormality. Hepatobiliary: No focal liver abnormality is seen. No gallstones, gallbladder wall thickening, or biliary dilatation. Pancreas: Unremarkable. No pancreatic ductal dilatation or surrounding inflammatory changes. Spleen: Normal in size without focal abnormality. Adrenals/Urinary Tract: Adrenal glands are unremarkable. Kidneys are normal, without renal calculi, focal lesion, or hydronephrosis. Bladder is unremarkable. Stomach/Bowel: There is a small hiatal hernia with mild thickening of the visualized portion of the distal esophagus. Appendix appears normal. Mildly dilated small bowel loops are seen within the abdomen on the left (maximum small bowel diameter of approximately 3.1 cm). A transition zone is seen within the right lower quadrant (axial CT images 62 through 68, CT series number 2). Vascular/Lymphatic: No significant vascular findings are present. No enlarged abdominal or pelvic lymph nodes. Reproductive: There is mild to moderate severity prostate gland enlargement. Multiple small prostate gland calcifications are seen. Other: No abdominal wall hernia or abnormality. No  abdominopelvic ascites. Musculoskeletal: No acute or significant osseous findings. IMPRESSION: 1. Findings consistent with a small bowel obstruction. 2. Small hiatal hernia with mild thickening of the visualized portion of the distal esophagus which may represent esophagitis. 3. Mild to moderate severity prostate gland enlargement. Electronically Signed   By: Aram Candela M.D.   On:  03/21/2020 17:43   DG Chest Portable 1 View  Result Date: 03/21/2020 CLINICAL DATA:  Epigastric/chest pain. EXAM: PORTABLE CHEST 1 VIEW COMPARISON:  March 31, 2019 FINDINGS: The heart size and mediastinal contours are within normal limits. Both lungs are clear. No visible pleural effusions or pneumothorax. Similar mildly elevated left hemidiaphragm. The visualized skeletal structures are unremarkable. IMPRESSION: No acute cardiopulmonary disease. Electronically Signed   By: Feliberto Harts MD   On: 03/21/2020 15:49   DG Abd Portable 1V-Small Bowel Obstruction Protocol-initial, 8 hr delay  Result Date: 03/22/2020 CLINICAL DATA:  8 hour delay EXAM: PORTABLE ABDOMEN - 1 VIEW COMPARISON:  03/21/2020 radiograph and CT FINDINGS: Esophageal tube tip and side-port over the gastric body. Enteral contrast within the colon and rectum. Mild air distension of central small bowel up to 3.6 cm. IMPRESSION: Enteral contrast within the colon and rectum. Mild air distension of central small bowel. Electronically Signed   By: Jasmine Pang M.D.   On: 03/22/2020 19:05   DG Abd Portable 1V  Result Date: 03/21/2020 CLINICAL DATA:  NG tube placement EXAM: PORTABLE ABDOMEN - 1 VIEW COMPARISON:  None. FINDINGS: The NG tube projects over the gastric body. The stomach is distended. There is non gaseous distention of loops of colon in the upper abdomen. IMPRESSION: NG tube projects over the gastric body. Electronically Signed   By: Katherine Mantle M.D.   On: 03/21/2020 21:20    Anti-infectives: Anti-infectives (From admission, onward)   None       Assessment/Plan Bipolar - home meds  Partial SBO - no PSH - CT showing SBO with transition point in RLQ - follow up film shows contrast in colon and rectum with less central small bowel distension - Clamp NG tube and allow clear liquid diet. Will recheck later today for possible NG tube removal and diet advancement if he tolerates clears. Mobilize.   ID -  none FEN - IVF, clamp NG tube, CLD VTE - SCDs, lovenox Foley - none Follow up - TBD    LOS: 2 days    Franne Forts, [facility] 03/23/2020, 8:58 AM Please see Amion for pager number during day hours 7:00am-4:30pm

## 2020-03-24 MED ORDER — INFLUENZA VAC SPLIT QUAD 0.5 ML IM SUSY: 0.5000 mL | PREFILLED_SYRINGE | INTRAMUSCULAR | Status: DC

## 2020-03-24 MED ORDER — INFLUENZA VAC SPLIT QUAD 0.5 ML IM SUSY
0.5000 mL | PREFILLED_SYRINGE | Freq: Once | INTRAMUSCULAR | Status: AC
  Filled 2020-03-24: qty 0.5

## 2020-03-24 MED ORDER — OMEPRAZOLE 20 MG PO CPDR: 20.0000 mg | DELAYED_RELEASE_CAPSULE | Freq: Two times a day (BID) | ORAL | Status: AC

## 2020-03-24 NOTE — Discharge Instructions (Signed)

## 2020-03-24 NOTE — Progress Notes (Signed)
I agree with students assessment

## 2020-03-24 NOTE — Discharge Summary (Signed)
[facility] Discharge Summary   Patient ID: Seth Bright MRN: [id number] DOB/AGE: August 24, 1960 59 y.o.  Admit date: 03/21/2020 Discharge date: 03/24/2020  Admitting Diagnosis: Small bowel obstruction  Discharge Diagnosis Patient Active Problem List   Diagnosis Date Noted  . SBO (small bowel obstruction) (HCC) 03/21/2020  . PTSD (post-traumatic stress disorder)   . Bipolar affective disorder, current episode manic (HCC) 05/31/2018    Consultants None  Imaging: DG Abd Portable 1V-Small Bowel Obstruction Protocol-initial, 8 hr delay  Result Date: 03/22/2020 CLINICAL DATA:  8 hour delay EXAM: PORTABLE ABDOMEN - 1 VIEW COMPARISON:  03/21/2020 radiograph and CT FINDINGS: Esophageal tube tip and side-port over the gastric body. Enteral contrast within the colon and rectum. Mild air distension of central small bowel up to 3.6 cm. IMPRESSION: Enteral contrast within the colon and rectum. Mild air distension of central small bowel. Electronically Signed   By: Jasmine Pang M.D.   On: 03/22/2020 19:05    Procedures None  Hospital Course:  Seth Bright is a 59yo male who presented to [facility] 10/24 with acute onset abdominal pain, nausea, and vomiting. No prior h/o abdominal surgery.  Workup included CT scan which showed SBO with transition point in RLQ.  Patient was admitted to the surgical service. NG tube placed for decompression. He was started on the small bowel protocol. Delayed film showed contrast in colon and rectum with less central small bowel distension. Bowel function returned. NG tube was removed and diet advanced as tolerated. On 10/27 the patient was tolerating diet, having bowel function, vital signs stable and felt stable for discharge home.  Patient will follow up as below and knows to call with questions or concerns.     Physical Exam: Gen: Alert, NAD, pleasant Card: RRR Pulm: CTAB, no W/R/R, rate and effort normal Abd: Soft, nondistended,  nontender,+BS Psych: A&Ox4  Skin: no rashes noted, warm and dry   Allergies as of 03/24/2020   No Known Allergies     Medication List    STOP taking these medications   LORazepam 1 MG tablet Commonly known as: ATIVAN   sertraline 50 MG tablet Commonly known as: ZOLOFT     TAKE these medications   acyclovir 400 MG tablet Commonly known as: ZOVIRAX Take 400 mg by mouth daily.   aspirin EC 81 MG tablet Take 1 tablet (81 mg total) by mouth daily.   FLUoxetine 20 MG capsule Commonly known as: PROZAC Take 20 mg by mouth daily.   gabapentin 300 MG capsule Commonly known as: NEURONTIN Take 1 capsule (300 mg total) by mouth 3 (three) times daily. For anxiety/agitation   hydrOXYzine 25 MG tablet Commonly known as: ATARAX/VISTARIL Take 25 mg by mouth every 8 (eight) hours as needed for anxiety.   lithium carbonate 300 MG capsule Take 1 capsule (300 mg total) by mouth every morning. For mood What changed:   how much to take  when to take this  additional instructions  Another medication with the same name was removed. Continue taking this medication, and follow the directions you see here.   omeprazole 20 MG capsule Commonly known as: PriLOSEC Take 1 capsule (20 mg total) by mouth 2 (two) times daily.   QUEtiapine 300 MG tablet Commonly known as: SEROQUEL Take 300 mg by mouth at bedtime. What changed: Another medication with the same name was removed. Continue taking this medication, and follow the directions you see here.   tadalafil 20 MG tablet Commonly known as: CIALIS Take 20  mg by mouth daily as needed for erectile dysfunction.   traZODone 50 MG tablet Commonly known as: DESYREL Take 1 tablet (50 mg total) by mouth at bedtime as needed for sleep. What changed: when to take this         Follow-up Information    [facility]. Call.   Specialty: Pediatrics Contact information: [address] [place]  [address] [phone]               Signed: Franne Forts, [facility] 03/24/2020, 8:26 AM Please see Amion for pager number during day hours 7:00am-4:30pm

## 2020-05-14 ENCOUNTER — Emergency Department (HOSPITAL_COMMUNITY)
Admission: EM | Admit: 2020-05-14 | Discharge: 2020-05-14 | Disposition: A | Discharge status: Home / Self Care | Payer: Medicaid Other | Attending: Emergency Medicine | Admitting: Emergency Medicine

## 2020-05-14 ENCOUNTER — Other Ambulatory Visit: Payer: Self-pay

## 2020-05-14 ENCOUNTER — Encounter (HOSPITAL_COMMUNITY): Payer: Self-pay

## 2020-05-14 DIAGNOSIS — Z7722 Contact with and (suspected) exposure to environmental tobacco smoke (acute) (chronic): Secondary | ICD-10-CM | POA: Diagnosis not present

## 2020-05-14 DIAGNOSIS — M542 Cervicalgia: Secondary | ICD-10-CM | POA: Insufficient documentation

## 2020-05-14 DIAGNOSIS — R200 Anesthesia of skin: Secondary | ICD-10-CM | POA: Diagnosis not present

## 2020-05-14 DIAGNOSIS — M79674 Pain in right toe(s): Secondary | ICD-10-CM | POA: Insufficient documentation

## 2020-05-14 DIAGNOSIS — M79602 Pain in left arm: Secondary | ICD-10-CM | POA: Diagnosis not present

## 2020-05-14 DIAGNOSIS — I10 Essential (primary) hypertension: Secondary | ICD-10-CM | POA: Diagnosis not present

## 2020-05-14 NOTE — ED Provider Notes (Signed)
[facility]-EMERGENCY DEPT Provider Note   CSN: [id number] Arrival date & time: 05/14/20  1143     History Chief Complaint  Patient presents with  . arm numbness  . toe numbness    Seth Bright is a 59 y.o. male with history of bipolar disorder, hypertension, PTSD, schizoaffective, alcohol abuse.  Patient presents with a chief complaint of right toe pain.  Reports that his pain started last night at 10 PM, lasted for 30 to 40 minutes, describes the pain as aching, 8/10 on the pain scale, no radiation,  no alleviating or aggravating symptoms.  Patient denies any previous pain like this before.  Patient denies any pain at present.  He denies any recent injury.    Patient also asked to be evaluated for his left arm pain.  He reports that he has intermittent left arm pain, pain comes on randomly,  pain starts in his neck and moves down his entire left arm, describes the pain as an aching, denies any alleviating or aggravating symptoms.  He states that he has been having this on and off for the last 2 months.  Reports last time he had this pain was approximately 1 to 2 days ago.  He does not have any pain at present.  He denies any neck or back pain, any traumatic injuries, numbness or tingling in arms or legs, weakness, fevers, chills, bowel or bladder dysfunction. Patient denies any active or previous cancer.    HPI     Past Medical History:  Diagnosis Date  . Alcohol abuse   . Bipolar 1 disorder (HCC)   . Herpes   . Hypertension   . PTSD (post-traumatic stress disorder)   . Schizo affective schizophrenia [facility])     Patient Active Problem List   Diagnosis Date Noted  . SBO (small bowel obstruction) (HCC) 03/21/2020  . PTSD (post-traumatic stress disorder)   . Bipolar affective disorder, current episode manic (HCC) 05/31/2018    Past Surgical History:  Procedure Laterality Date  . CARDIAC SURGERY     stent placement 2016       Family History   Problem Relation Age of Onset  . Cancer Mother   . Heart attack Father     Social History   Tobacco Use  . Smoking status: Passive Smoke Exposure - Never Smoker  . Smokeless tobacco: Never Used  Vaping Use  . Vaping Use: Never used  Substance Use Topics  . Alcohol use: No  . Drug use: No    Home Medications Prior to Admission medications   Medication Sig Start Date End Date Taking? Authorizing Provider  acyclovir (ZOVIRAX) 400 MG tablet Take 400 mg by mouth daily.    [provider]  FLUoxetine (PROZAC) 20 MG capsule Take 20 mg by mouth daily. 02/09/20   [provider]  gabapentin (NEURONTIN) 300 MG capsule Take 1 capsule (300 mg total) by mouth 3 (three) times daily. For anxiety/agitation 06/07/18   Aldean Baker, NP  hydrOXYzine (ATARAX/VISTARIL) 25 MG tablet Take 25 mg by mouth every 8 (eight) hours as needed for anxiety.  02/09/20   [provider]  lithium carbonate 300 MG capsule Take 1 capsule (300 mg total) by mouth every morning. For mood Patient taking differently: Take 300-600 mg by mouth See admin instructions. Take one capsule (300 mg) by mouth every morning and two capsules (600 mg) at night - For mood 06/08/18   Aldean Baker, NP  omeprazole (PRILOSEC) 20  MG capsule Take 1 capsule (20 mg total) by mouth 2 (two) times daily. 03/24/20 04/23/20  Meuth, Brooke A, PA-C  QUEtiapine (SEROQUEL) 300 MG tablet Take 300 mg by mouth at bedtime. 01/26/20   [provider]  tadalafil (CIALIS) 20 MG tablet Take 20 mg by mouth daily as needed for erectile dysfunction.    [provider]  traZODone (DESYREL) 50 MG tablet Take 1 tablet (50 mg total) by mouth at bedtime as needed for sleep. Patient taking differently: Take 50 mg by mouth at bedtime.  06/07/18   Aldean Baker, NP    Allergies    Patient has no known allergies.  Review of Systems   Review of Systems  Constitutional: Negative for chills and fever.  Eyes: Negative for visual  disturbance.  Respiratory: Negative for cough and shortness of breath.   Cardiovascular: Negative for chest pain.  Gastrointestinal: Negative for abdominal pain, nausea and vomiting.  Genitourinary: Negative for difficulty urinating and dysuria.  Musculoskeletal: Negative for back pain, joint swelling and neck pain.  Skin: Negative for color change and rash.  Neurological: Negative for dizziness, seizures, syncope, facial asymmetry, speech difficulty, weakness, light-headedness and headaches.  Psychiatric/Behavioral: Negative for confusion.    Physical Exam Updated Vital Signs BP 129/75 (BP Location: Right Arm)   Pulse 89   Temp 98.7 F (37.1 C) (Oral)   Resp 16   Ht 5\' 5"  (1.651 m)   Wt 81.6 kg   SpO2 95%   BMI 29.95 kg/m   Physical Exam Constitutional:      General: He is not in acute distress.    Appearance: He is not ill-appearing, toxic-appearing or diaphoretic.  HENT:     Head: Normocephalic and atraumatic. No raccoon eyes or Battle's sign.  Eyes:     General: Vision grossly intact. No scleral icterus.       Right eye: No discharge.        Left eye: No discharge.     Extraocular Movements: Extraocular movements intact.     Conjunctiva/sclera:     Right eye: No hemorrhage.    Left eye: No hemorrhage.    Pupils: Pupils are equal, round, and reactive to light.  Cardiovascular:     Rate and Rhythm: Normal rate and regular rhythm.     Pulses:          Dorsalis pedis pulses are 3+ on the right side and 3+ on the left side.     Heart sounds: Normal heart sounds.  Pulmonary:     Effort: Pulmonary effort is normal.     Breath sounds: Normal breath sounds.  Abdominal:     General: There is no distension.     Palpations: Abdomen is soft.     Tenderness: There is no abdominal tenderness.  Musculoskeletal:     Right shoulder: No swelling, deformity, tenderness or bony tenderness.     Left shoulder: No deformity, tenderness, bony tenderness or crepitus. Normal range of  motion. Normal strength.     Right upper arm: No tenderness or bony tenderness.     Left upper arm: No tenderness or bony tenderness.     Right elbow: Normal range of motion. No tenderness.     Left elbow: Normal range of motion. No tenderness.     Right forearm: No tenderness or bony tenderness.     Left forearm: No tenderness or bony tenderness.     Cervical back: Normal range of motion and neck supple. No deformity, rigidity,  torticollis, tenderness, bony tenderness or crepitus. No spinous process tenderness or muscular tenderness.     Thoracic back: No deformity, tenderness or bony tenderness.     Lumbar back: No deformity, tenderness or bony tenderness. Negative right straight leg raise test and negative left straight leg raise test.     Right foot: Normal range of motion. No deformity.     Left foot: Normal range of motion. No deformity.  Feet:     Right foot:     Skin integrity: Skin integrity normal.     Left foot:     Skin integrity: Skin integrity normal.  Skin:    General: Skin is warm and dry.     Capillary Refill: Capillary refill takes less than 2 seconds.  Neurological:     General: No focal deficit present.     Mental Status: He is alert.     GCS: GCS eye subscore is 4. GCS verbal subscore is 5. GCS motor subscore is 6.     Cranial Nerves: No cranial nerve deficit or facial asymmetry.     Sensory: Sensation is intact.     Motor: No weakness, tremor, seizure activity or pronator drift.     Coordination: Romberg sign negative. Finger-Nose-Finger Test normal.     Gait: Gait normal.     Comments: CN II-XII intact, +5 strength to upper and lower extremities, equal grip strength    Psychiatric:        Behavior: Behavior is cooperative.     ED Results / Procedures / Treatments   Labs (all labs ordered are listed, but only abnormal results are displayed) Labs Reviewed - No data to display  EKG None  Radiology No results found.  Procedures Procedures (including  critical care time)  Medications Ordered in ED Medications - No data to display  ED Course  I have reviewed the triage vital signs and the nursing notes.  Pertinent labs & imaging results that were available during my care of the patient were reviewed by me and considered in my medical decision making (see chart for details).    MDM Rules/Calculators/A&P                           Alert 59 year old male in no acute distress.  He has no pain at present.  No neurological deficit noted.  No tenderness or decreased range of motion to left shoulder/arm or right foot/ankle.  No skin abnormalities of right foot.  Left arm pain may be due to cervical radiculopathy.  Will have patient follow-up with his primary care provider to schedule a MRI.  Will have patient follow-up with his primary care provider if his symptoms persist.  Discussed  findings, treatment and follow up. Patient advised of return precautions. Patient verbalized understanding and agreed with plan.     Final Clinical Impression(s) / ED Diagnoses Final diagnoses:  Toe pain, right  Left arm pain    Rx / DC Orders ED Discharge Orders    None       Berneice Heinrich 05/14/20 1249    Lorre Nick, MD 05/15/20 1325

## 2020-05-14 NOTE — Discharge Instructions (Addendum)
You came to the emergency department today to be evaluated for your left arm pain and your right toe pain.  Your physical exam was very reassuring.  Your left arm pain may be due to cervical radiculopathy, as we mentioned please follow-up with your primary care provider who can order a MRI to work this up further if necessary.  Please follow-up with your primary care provider if your symptoms persist.  If your symptoms worsen please return to the emergency department.    Get help right away if: Your foot is numb or tingling. Your foot or toes are swollen. Your foot or toes turn white or blue. You have warmth and redness along your foot. Your pain gets much worse and cannot be controlled with medicines. You have weakness or numbness in your hand, arm, face, or leg. You have a high fever. You have a stiff, rigid neck. You lose control of your bowels or your bladder (have incontinence). You have trouble with walking, balance, or speaking.

## 2020-05-14 NOTE — ED Triage Notes (Signed)
Patient c/o intermittent left arm numbness x 2 months and right toe numbness (tips) since last night.

## 2020-06-18 ENCOUNTER — Other Ambulatory Visit: Payer: Self-pay

## 2020-06-18 ENCOUNTER — Encounter (HOSPITAL_COMMUNITY): Payer: Self-pay | Admitting: *Deleted

## 2020-06-18 ENCOUNTER — Emergency Department (HOSPITAL_COMMUNITY): Payer: Medicaid Other

## 2020-06-18 ENCOUNTER — Emergency Department (HOSPITAL_COMMUNITY)
Admission: EM | Admit: 2020-06-18 | Discharge: 2020-06-18 | Disposition: A | Discharge status: Home / Self Care | Payer: Medicaid Other | Attending: Emergency Medicine | Admitting: Emergency Medicine

## 2020-06-18 DIAGNOSIS — Z7722 Contact with and (suspected) exposure to environmental tobacco smoke (acute) (chronic): Secondary | ICD-10-CM | POA: Diagnosis not present

## 2020-06-18 DIAGNOSIS — Z20822 Contact with and (suspected) exposure to covid-19: Secondary | ICD-10-CM

## 2020-06-18 DIAGNOSIS — R059 Cough, unspecified: Secondary | ICD-10-CM | POA: Diagnosis present

## 2020-06-18 DIAGNOSIS — R911 Solitary pulmonary nodule: Secondary | ICD-10-CM | POA: Diagnosis not present

## 2020-06-18 DIAGNOSIS — U071 COVID-19: Secondary | ICD-10-CM | POA: Diagnosis not present

## 2020-06-18 DIAGNOSIS — I10 Essential (primary) hypertension: Secondary | ICD-10-CM | POA: Insufficient documentation

## 2020-06-18 DIAGNOSIS — J069 Acute upper respiratory infection, unspecified: Secondary | ICD-10-CM | POA: Diagnosis not present

## 2020-06-18 LAB — SARS CORONAVIRUS 2 (TAT 6-24 HRS): SARS Coronavirus 2: POSITIVE — AB

## 2020-06-18 NOTE — Discharge Instructions (Addendum)
At this time there does not appear to be the presence of an emergent medical condition, however there is always the potential for conditions to change. Please read and follow the below instructions.  Please return to the Emergency Department immediately for any new or worsening symptoms. Please be sure to follow up with your Primary Care Provider within one week regarding your visit today; please call their office to schedule an appointment even if you are feeling better for a follow-up visit. Your COVID test should result in the next 24 hours please check your MyChart account for those results and discuss them with your primary care provider when available.  Please drink plenty water and get plenty of rest.  You may use over-the-counter anti-inflammatories to help with your symptoms. Your chest x-ray today showed a possible nodule on your right lung, this will need a follow-up CT scan for further evaluation.  Please call your primary care doctor's office today to discuss that with him and schedule future imaging.  Go to the nearest Emergency Department immediately if: You have fever or chills You have very bad or constant: Headache. Ear pain. Pain in your forehead, behind your eyes, and over your cheekbones (sinus pain). Chest pain. You have long-lasting (chronic) lung disease along with any of these: Wheezing. Long-lasting cough. Coughing up blood. A change in your usual mucus. You have a stiff neck. You have changes in your: Vision. Hearing. Thinking. Mood. You have chest pain or difficulty breathing. You have any new/concerning or worsening of symptoms.   Please read the additional information packets attached to your discharge summary.  Do not take your medicine if  develop an itchy rash, swelling in your mouth or lips, or difficulty breathing; call 911 and seek immediate emergency medical attention if this occurs.  You may review your lab tests and imaging results in their  entirety on your MyChart account.  Please discuss all results of fully with your primary care provider and other specialist at your follow-up visit.  Note: Portions of this text may have been transcribed using voice recognition software. Every effort was made to ensure accuracy; however, inadvertent computerized transcription errors may still be present.

## 2020-06-18 NOTE — ED Triage Notes (Signed)
Pt complains of chills, sore throat, headache, cough x 5 days. No known covid exposure. Had 2 COVID vaccinations.

## 2020-06-18 NOTE — ED Provider Notes (Signed)
[facility]-EMERGENCY DEPT Provider Note   CSN: [id number] Arrival date & time: 06/18/20  1121     History Chief Complaint  Patient presents with  . Cough  . Chills  . Sore Throat    Seth Bright is a 60 y.o. male negative history of bipolar, alcohol abuse, schizophrenia, hypertension, herpes, SBO.  Patient arrives today for 5-day history of viral URI.  He reports feeling cold at home has no measured fever.  He describes a mild scratchy sore throat bilateral no aggravating factors, completely relieved with Hall's drops he reports mild nonproductive cough for 5 days as well without chest pain or shortness of breath.  Patient reports he has been vaccinated against COVID x2 and has no known sick contacts  Denies measured fever, denies headache, vision changes, neck stiffness, difficulty swallowing, voice change, chest pain/shortness of breath, productive cough, hemoptysis, abdominal pain, ACE inhibitor use, nausea/vomiting, diarrhea, extremity swelling/color change or any additional concerns.  HPI     Past Medical History:  Diagnosis Date  . Alcohol abuse   . Bipolar 1 disorder (HCC)   . Herpes   . Hypertension   . PTSD (post-traumatic stress disorder)   . Schizo affective schizophrenia [facility])     Patient Active Problem List   Diagnosis Date Noted  . SBO (small bowel obstruction) (HCC) 03/21/2020  . PTSD (post-traumatic stress disorder)   . Bipolar affective disorder, current episode manic (HCC) 05/31/2018    Past Surgical History:  Procedure Laterality Date  . CARDIAC SURGERY     stent placement 2016       Family History  Problem Relation Age of Onset  . Cancer Mother   . Heart attack Father     Social History   Tobacco Use  . Smoking status: Passive Smoke Exposure - Never Smoker  . Smokeless tobacco: Never Used  Vaping Use  . Vaping Use: Never used  Substance Use Topics  . Alcohol use: No  . Drug use: No    Home  Medications Prior to Admission medications   Medication Sig Start Date End Date Taking? Authorizing Provider  acyclovir (ZOVIRAX) 400 MG tablet Take 400 mg by mouth daily.    [provider]  FLUoxetine (PROZAC) 20 MG capsule Take 20 mg by mouth daily. 02/09/20   [provider]  gabapentin (NEURONTIN) 300 MG capsule Take 1 capsule (300 mg total) by mouth 3 (three) times daily. For anxiety/agitation 06/07/18   Aldean Baker, NP  hydrOXYzine (ATARAX/VISTARIL) 25 MG tablet Take 25 mg by mouth every 8 (eight) hours as needed for anxiety.  02/09/20   [provider]  lithium carbonate 300 MG capsule Take 1 capsule (300 mg total) by mouth every morning. For mood Patient taking differently: Take 300-600 mg by mouth See admin instructions. Take one capsule (300 mg) by mouth every morning and two capsules (600 mg) at night - For mood 06/08/18   Aldean Baker, NP  omeprazole (PRILOSEC) 20 MG capsule Take 1 capsule (20 mg total) by mouth 2 (two) times daily. 03/24/20 04/23/20  Meuth, Brooke A, PA-C  QUEtiapine (SEROQUEL) 300 MG tablet Take 300 mg by mouth at bedtime. 01/26/20   [provider]  tadalafil (CIALIS) 20 MG tablet Take 20 mg by mouth daily as needed for erectile dysfunction.    [provider]  traZODone (DESYREL) 50 MG tablet Take 1 tablet (50 mg total) by mouth at bedtime as needed for sleep. Patient taking differently: Take 50  mg by mouth at bedtime.  06/07/18   Aldean BakerSykes, Janet E, NP    Allergies    Patient has no known allergies.  Review of Systems   Review of Systems  Constitutional: Positive for chills. Negative for fever.  HENT: Positive for rhinorrhea and sore throat (improving). Negative for drooling, facial swelling, trouble swallowing and voice change.   Respiratory: Positive for cough. Negative for shortness of breath.   Cardiovascular: Negative.  Negative for chest pain and leg swelling.  Gastrointestinal: Negative.  Negative for abdominal  pain, diarrhea, nausea and vomiting.  Musculoskeletal: Negative for neck pain and neck stiffness.  Neurological: Negative.  Negative for weakness, numbness and headaches.     Physical Exam Updated Vital Signs BP (!) 142/94 (BP Location: Left Arm)   Pulse 97   Temp 98.2 F (36.8 C) (Oral)   Resp 18   SpO2 94%   Physical Exam Constitutional:      General: He is not in acute distress.    Appearance: Normal appearance. He is well-developed. He is not ill-appearing or diaphoretic.  HENT:     Head: Normocephalic and atraumatic.     Jaw: There is normal jaw occlusion.     Nose: Rhinorrhea present. Rhinorrhea is clear.     Right Nostril: No epistaxis.     Left Nostril: No epistaxis.     Mouth/Throat:     Mouth: Mucous membranes are moist.     Pharynx: Oropharynx is clear. Uvula midline.     Comments: Postnasal drip and posterior pharynx cobblestoning.  The patient has normal phonation and is in control of secretions. No stridor.  Midline uvula without edema. Soft palate rises symmetrically. No tonsillar erythema, swelling or exudates. Tongue protrusion is normal, floor of mouth is soft. No trismus. No creptius on neck palpation. No gingival erythema or fluctuance noted. Mucus membranes moist. No pallor noted.  Eyes:     General: Vision grossly intact. Gaze aligned appropriately.     Pupils: Pupils are equal, round, and reactive to light.  Neck:     Trachea: Trachea and phonation normal.  Cardiovascular:     Rate and Rhythm: Normal rate and regular rhythm.  Pulmonary:     Effort: Pulmonary effort is normal. No respiratory distress.     Breath sounds: Normal breath sounds.  Abdominal:     General: There is no distension.     Palpations: Abdomen is soft.     Tenderness: There is no abdominal tenderness. There is no guarding or rebound.  Musculoskeletal:        General: Normal range of motion.     Cervical back: Normal range of motion.  Skin:    General: Skin is warm and dry.   Neurological:     Mental Status: He is alert.     GCS: GCS eye subscore is 4. GCS verbal subscore is 5. GCS motor subscore is 6.     Comments: Speech is clear and goal oriented, follows commands Major Cranial nerves without deficit, no facial droop Moves extremities without ataxia, coordination intact  Psychiatric:        Behavior: Behavior normal.     ED Results / Procedures / Treatments   Labs (all labs ordered are listed, but only abnormal results are displayed) Labs Reviewed  SARS CORONAVIRUS 2 (TAT 6-24 HRS)    EKG None  Radiology DG Chest Portable 1 View  Result Date: 06/18/2020 CLINICAL DATA:  Cough. EXAM: PORTABLE CHEST 1 VIEW COMPARISON:  March 21, 2020.  FINDINGS: The heart size and mediastinal contours are within normal limits. No pneumothorax or pleural effusion is noted. Left lung is clear. Possible peripheral nodular density seen in right lower lobe. The visualized skeletal structures are unremarkable. IMPRESSION: Possible peripheral nodular density seen in right lower lobe. CT scan of the chest is recommended for further evaluation. Electronically Signed   By: Lupita Raider M.D.   On: 06/18/2020 13:15    Procedures Procedures (including critical care time)  Medications Ordered in ED Medications - No data to display  ED Course  I have reviewed the triage vital signs and the nursing notes.  Pertinent labs & imaging results that were available during my care of the patient were reviewed by me and considered in my medical decision making (see chart for details).    MDM Rules/Calculators/A&P                         Additional history obtained from: 1. Nursing notes from this visit. 2. Review of electronic medical records ---------------------- CXR:  IMPRESSION:  Possible peripheral nodular density seen in right lower lobe. CT  scan of the chest is recommended for further evaluation.   60 year old male presents with 5-day history of URI.  On exam he is  well-appearing and in no acute distress.  Cranial nerves intact, no meningeal signs.  Airway clear without evidence of PTA, RPA, sinusitis, Ludwig's or other deep space infections of the head or neck.  Additionally no evidence for tonsillitis, he has some mild posterior pharynx cobblestoning and postnasal drip.  Cardiopulmonary exam is unremarkable.  Abdomen soft nontender.  No GI symptoms.  Neurovascular tact all 4 extremities without evidence of DVT.  Patient's vital signs are stable without tachycardia, hypoxia or tachypnea on room air.  He denies any chest pain or shortness of breath.  Suspect patient suffering from a mild viral URI at this time, doubt bacterial pneumonia, meningitis, ACS, PE or other emergent pathologies as cause of cough sore throat chills.  Encourage patient to continue using home symptomatic therapies and follow-up with his PCP.  Patient informed of incidental pulmonary nodule seen on x-ray today and that he will need to schedule a follow-up CT scan with his primary care provider.  Patient had COVID test performed however the testing limitations the send out COVID test was obtained, he is aware to follow-up on his test on his MyChart account and discuss those results with his PCP when available.  Discussed quarantine/isolation precautions.  Encourage patient maintain water hydration and rest.  At this time there does not appear to be any evidence of an acute emergency medical condition and the patient appears stable for discharge with appropriate outpatient follow up. Diagnosis was discussed with patient who verbalizes understanding of care plan and is agreeable to discharge. I have discussed return precautions with patient who verbalizes understanding. Patient encouraged to follow-up with their PCP. All questions answered.  Patient's case discussed with Dr. Renaye Rakers who agrees with plan to discharge with follow-up.   Seth Bright was evaluated in Emergency Department on 06/18/2020  for the symptoms described in the history of present illness. He was evaluated in the context of the global COVID-19 pandemic, which necessitated consideration that the patient might be at risk for infection with the SARS-CoV-2 virus that causes COVID-19. Institutional protocols and algorithms that pertain to the evaluation of patients at risk for COVID-19 are in a state of rapid change based on information released by  regulatory bodies including the CDC and federal and state organizations. These policies and algorithms were followed during the patient's care in the ED.   Note: Portions of this report may have been transcribed using voice recognition software. Every effort was made to ensure accuracy; however, inadvertent computerized transcription errors may still be present. Final Clinical Impression(s) / ED Diagnoses Final diagnoses:  Viral upper respiratory tract infection  Lung nodule  Encounter for laboratory testing for COVID-19 virus    Rx / DC Orders ED Discharge Orders    None       Elizabeth Palau 06/18/20 1409    Terald Sleeper, MD 06/18/20 2208

## 2020-10-10 ENCOUNTER — Emergency Department (HOSPITAL_COMMUNITY)
Admission: EM | Admit: 2020-10-10 | Discharge: 2020-10-10 | Disposition: A | Discharge status: Home / Self Care | Payer: Medicaid Other | Attending: Emergency Medicine | Admitting: Emergency Medicine

## 2020-10-10 ENCOUNTER — Other Ambulatory Visit: Payer: Self-pay

## 2020-10-10 ENCOUNTER — Emergency Department (HOSPITAL_COMMUNITY)
Admission: EM | Admit: 2020-10-10 | Discharge: 2020-10-10 | Discharge status: Against Medical Advice | Payer: Medicaid Other

## 2020-10-10 ENCOUNTER — Encounter (HOSPITAL_COMMUNITY): Payer: Self-pay | Admitting: Emergency Medicine

## 2020-10-10 ENCOUNTER — Emergency Department (HOSPITAL_COMMUNITY): Payer: Medicaid Other

## 2020-10-10 DIAGNOSIS — Z79899 Other long term (current) drug therapy: Secondary | ICD-10-CM | POA: Insufficient documentation

## 2020-10-10 DIAGNOSIS — S61210A Laceration without foreign body of right index finger without damage to nail, initial encounter: Secondary | ICD-10-CM | POA: Diagnosis not present

## 2020-10-10 DIAGNOSIS — Y92410 Unspecified street and highway as the place of occurrence of the external cause: Secondary | ICD-10-CM | POA: Diagnosis not present

## 2020-10-10 DIAGNOSIS — I10 Essential (primary) hypertension: Secondary | ICD-10-CM | POA: Insufficient documentation

## 2020-10-10 DIAGNOSIS — Z7722 Contact with and (suspected) exposure to environmental tobacco smoke (acute) (chronic): Secondary | ICD-10-CM | POA: Diagnosis not present

## 2020-10-10 DIAGNOSIS — R519 Headache, unspecified: Secondary | ICD-10-CM

## 2020-10-10 MED ORDER — METHOCARBAMOL 500 MG PO TABS: 500.0000 mg | ORAL_TABLET | Freq: Two times a day (BID) | ORAL | 0 refills | Status: AC

## 2020-10-10 MED ORDER — IBUPROFEN 800 MG PO TABS
800.0000 mg | ORAL_TABLET | Freq: Once | ORAL | Status: AC
  Administered 2020-10-10: 800 mg via ORAL
  Filled 2020-10-10: qty 1
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
## 2020-10-10 NOTE — ED Notes (Signed)
At [facility] ed
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
## 2020-10-10 NOTE — Discharge Instructions (Addendum)
1. Medications: Ibuprofen as needed for headache or achiness.  Please be very careful with this as you are on several medications which can potentiate the effects of this.  Robaxin for muscle spasms. Usual home medications 2. Treatment: rest, drink plenty of fluids, gentle stretching 3. Follow Up: Please followup with your primary doctor in 1-2 days for discussion of your diagnoses and further evaluation after today's visit; if you do not have a primary care doctor use the resource guide provided to find one; Please return to the ER for worsening headache, changes in vision, passing out, near passing out or vomiting.

## 2020-12-17 ENCOUNTER — Emergency Department (HOSPITAL_COMMUNITY): Payer: Medicaid Other

## 2020-12-17 ENCOUNTER — Encounter (HOSPITAL_COMMUNITY): Payer: Self-pay

## 2020-12-17 ENCOUNTER — Other Ambulatory Visit: Payer: Self-pay

## 2020-12-17 ENCOUNTER — Emergency Department (HOSPITAL_COMMUNITY)
Admission: EM | Admit: 2020-12-17 | Discharge: 2020-12-17 | Disposition: A | Discharge status: Home / Self Care | Payer: Medicaid Other | Attending: Emergency Medicine | Admitting: Emergency Medicine

## 2020-12-17 DIAGNOSIS — M25512 Pain in left shoulder: Secondary | ICD-10-CM | POA: Diagnosis not present

## 2020-12-17 DIAGNOSIS — M546 Pain in thoracic spine: Secondary | ICD-10-CM | POA: Diagnosis not present

## 2020-12-17 DIAGNOSIS — R0602 Shortness of breath: Secondary | ICD-10-CM | POA: Insufficient documentation

## 2020-12-17 DIAGNOSIS — Z5321 Procedure and treatment not carried out due to patient leaving prior to being seen by health care provider: Secondary | ICD-10-CM | POA: Insufficient documentation

## 2020-12-17 LAB — CBC WITH DIFFERENTIAL/PLATELET
Abs Immature Granulocytes: 0.04 10*3/uL (ref 0.00–0.07)
Basophils Absolute: 0 10*3/uL (ref 0.0–0.1)
Basophils Relative: 1 %
Eosinophils Absolute: 0 10*3/uL (ref 0.0–0.5)
Eosinophils Relative: 1 %
HCT: 44.6 % (ref 39.0–52.0)
Hemoglobin: 14.8 g/dL (ref 13.0–17.0)
Immature Granulocytes: 1 %
Lymphocytes Relative: 11 %
Lymphs Abs: 0.9 10*3/uL (ref 0.7–4.0)
MCH: 27 pg (ref 26.0–34.0)
MCHC: 33.2 g/dL (ref 30.0–36.0)
MCV: 81.2 fL (ref 80.0–100.0)
Monocytes Absolute: 0.8 10*3/uL (ref 0.1–1.0)
Monocytes Relative: 10 %
Neutro Abs: 6.3 10*3/uL (ref 1.7–7.7)
Neutrophils Relative %: 76 %
Platelets: 359 10*3/uL (ref 150–400)
RBC: 5.49 MIL/uL (ref 4.22–5.81)
RDW: 14.6 % (ref 11.5–15.5)
WBC: 8 10*3/uL (ref 4.0–10.5)
nRBC: 0 % (ref 0.0–0.2)

## 2020-12-17 LAB — BASIC METABOLIC PANEL
Anion gap: 7 (ref 5–15)
BUN: 10 mg/dL (ref 6–20)
CO2: 26 mmol/L (ref 22–32)
Calcium: 9.2 mg/dL (ref 8.9–10.3)
Chloride: 107 mmol/L (ref 98–111)
Creatinine, Ser: 0.79 mg/dL (ref 0.61–1.24)
GFR, Estimated: >60 mL/min (ref >60)
Glucose, Bld: 107 mg/dL — ABNORMAL HIGH (ref 70–99)
Potassium: 3.5 mmol/L (ref 3.5–5.1)
Sodium: 140 mmol/L (ref 135–145)

## 2020-12-17 LAB — D-DIMER, QUANTITATIVE: D-Dimer, Quant: <0.27 ug/mL-FEU (ref 0.00–0.50)

## 2020-12-17 LAB — TROPONIN I (HIGH SENSITIVITY): Troponin I (High Sensitivity): 2 ng/L (ref <18)

## 2020-12-17 MED ORDER — ALBUTEROL SULFATE HFA 108 (90 BASE) MCG/ACT IN AERS: 2.0000 | INHALATION_SPRAY | RESPIRATORY_TRACT | Status: DC | PRN

## 2020-12-17 NOTE — ED Triage Notes (Addendum)
Patient states he began having left scapula pressure at 1200 and worsening then he had SOB at 1400.

## 2020-12-17 NOTE — ED Notes (Signed)
Pt reports they are leaving and turned in their labels

## 2020-12-17 NOTE — ED Provider Notes (Signed)
Emergency Medicine Provider Triage Evaluation Note  Seth Bright , a 60 y.o. male  was evaluated in triage.  Pt complains of left scapular pain.  Described as sharp and stabbing.  Pleuritic in nature.  Not Nestl worse with movement.  Has associated shortness of breath.  Recently drove back from [place].  No lower extremity edema, erythema or warmth.  No prior history of PE or DVT.  No cough.  No known history of asthma, COPD.  Does have history of MI requiring stenting this does not feel similar.  Review of Systems  Positive: Pleuritic chest pain, scapula pain, shortness of breath Negative: Cough, lower extremity edema, fever, chills  Physical Exam  BP 122/73 (BP Location: Right Arm)   Pulse 73   Temp 98.2 F (36.8 C) (Oral)   Resp 19   Ht 5\' 5"  (1.651 m)   Wt 75.8 kg   SpO2 98%   BMI 27.79 kg/m  Gen:   Awake, no distress   Resp:  Normal effort  MSK:   Moves extremities without difficulty  Other:    Medical Decision Making  Medically screening exam initiated at 5:41 PM.  Appropriate orders placed.  Bright was informed that the remainder of the evaluation will be completed by another provider, this initial triage assessment does not replace that evaluation, and the importance of remaining in the ED until their evaluation is complete.  Pleuritic chest pain, scapular pain  Hemodynamically stable.  Labs imaging ordered   Harrington Challenger, PA-C 12/17/20 1742    12/19/20, MD 12/18/20 [phone]

## 2022-01-02 IMAGING — CR DG CHEST 2V
2 series · 2 of 2 positions shown · non-contrast
Comparison: 06/18/2020, chest CT 11/06/2017

CLINICAL DATA: Shortness of breath.

EXAM:
CHEST - 2 VIEW

[w chest pa]
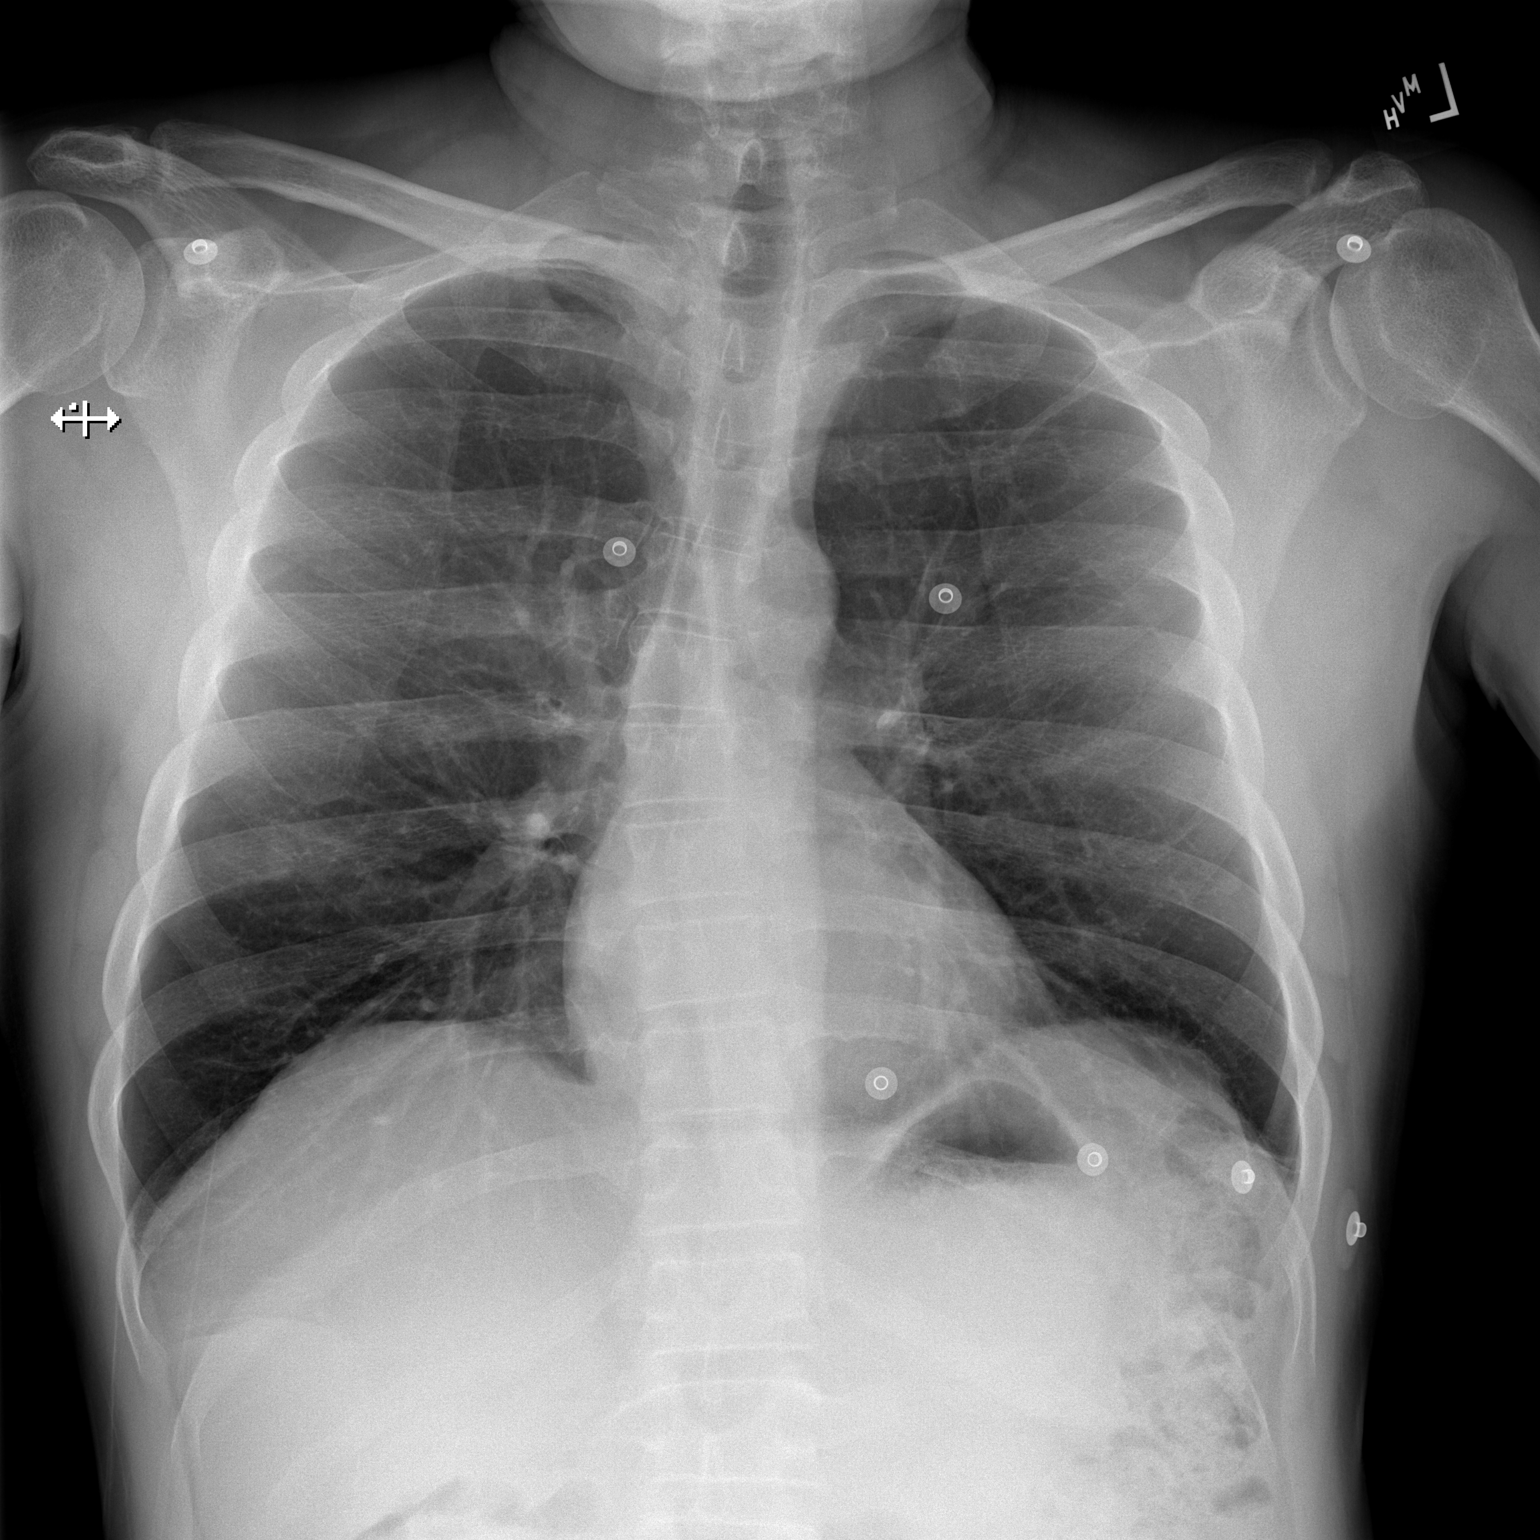

[w chest lat]
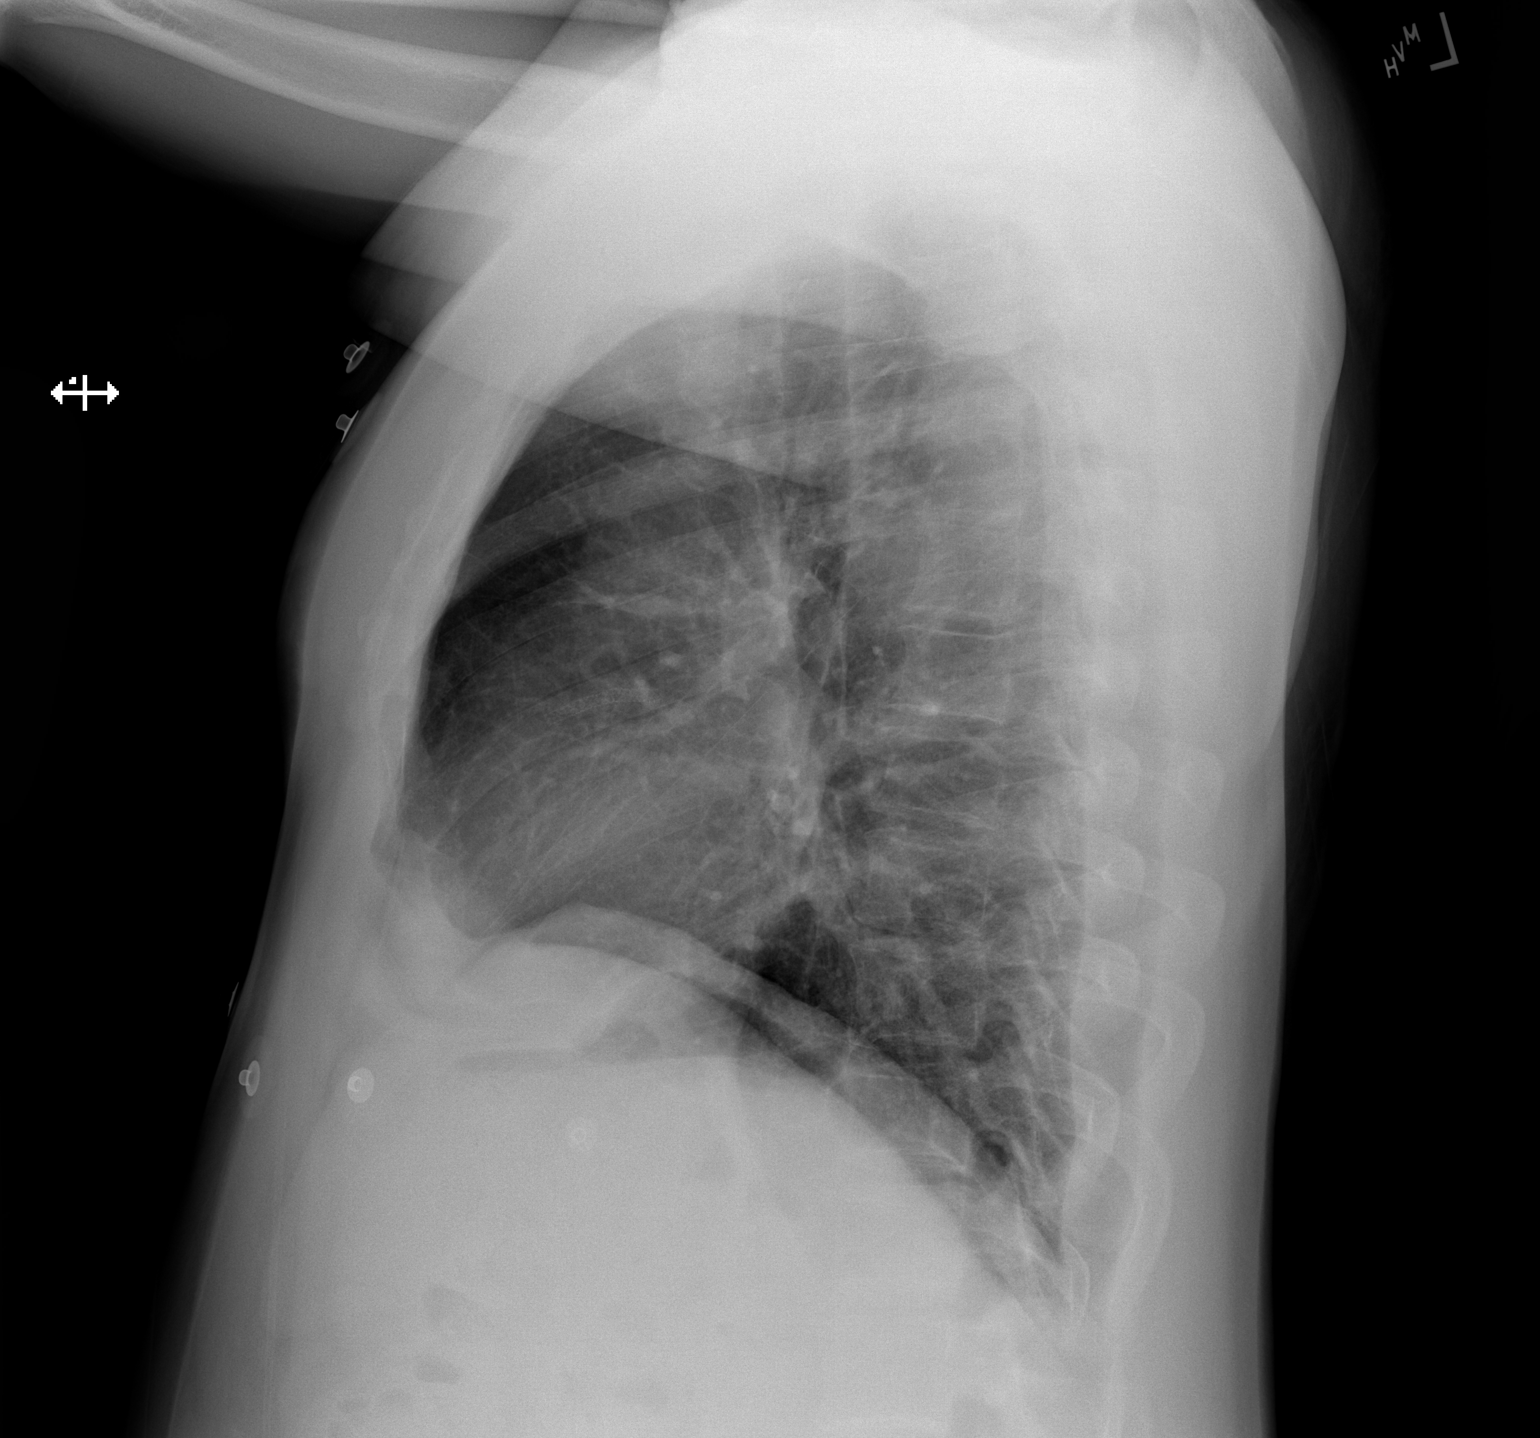

[2 of 2 positions shown; findings below may reference images not displayed]

FINDINGS: The cardiomediastinal contours are normal. Coronary stent is
visualized. Questionable subcentimeter nodule inferior to the right
scapula, also question on prior exam. Pulmonary vasculature is
normal. No consolidation, pleural effusion, or pneumothorax. Mild
dextroscoliotic curvature of the thoracic spine. No acute osseous
abnormalities are seen.
IMPRESSION: 1. No acute chest findings.
2. Questionable subcentimeter nodule inferior to the right scapula,
also questioned on prior radiograph, with no nodule seen on 1043 CT.
Recommend chest CT follow-up on an elective basis.
